# Patient Record
Sex: Female | Born: 1960
Health system: Southern US, Community
[De-identification: ages and names within clinical notes are randomized; demographics above are authoritative.]

## PROBLEM LIST (undated history)

## (undated) DIAGNOSIS — R51 Headache: Secondary | ICD-10-CM

## (undated) DIAGNOSIS — M1990 Unspecified osteoarthritis, unspecified site: Secondary | ICD-10-CM

## (undated) DIAGNOSIS — R002 Palpitations: Secondary | ICD-10-CM

## (undated) DIAGNOSIS — M179 Osteoarthritis of knee, unspecified: Secondary | ICD-10-CM

## (undated) DIAGNOSIS — E559 Vitamin D deficiency, unspecified: Secondary | ICD-10-CM

## (undated) DIAGNOSIS — K5900 Constipation, unspecified: Secondary | ICD-10-CM

## (undated) DIAGNOSIS — J309 Allergic rhinitis, unspecified: Secondary | ICD-10-CM

## (undated) DIAGNOSIS — M2550 Pain in unspecified joint: Secondary | ICD-10-CM

## (undated) DIAGNOSIS — M419 Scoliosis, unspecified: Secondary | ICD-10-CM

## (undated) DIAGNOSIS — R609 Edema, unspecified: Secondary | ICD-10-CM

## (undated) DIAGNOSIS — F419 Anxiety disorder, unspecified: Secondary | ICD-10-CM

## (undated) DIAGNOSIS — K219 Gastro-esophageal reflux disease without esophagitis: Secondary | ICD-10-CM

## (undated) DIAGNOSIS — M722 Plantar fascial fibromatosis: Secondary | ICD-10-CM

## (undated) DIAGNOSIS — M1710 Unilateral primary osteoarthritis, unspecified knee: Secondary | ICD-10-CM

## (undated) DIAGNOSIS — L700 Acne vulgaris: Secondary | ICD-10-CM

## (undated) DIAGNOSIS — R7303 Prediabetes: Secondary | ICD-10-CM

## (undated) DIAGNOSIS — I82409 Acute embolism and thrombosis of unspecified deep veins of unspecified lower extremity: Secondary | ICD-10-CM

## (undated) DIAGNOSIS — M719 Bursopathy, unspecified: Secondary | ICD-10-CM

## (undated) DIAGNOSIS — B351 Tinea unguium: Secondary | ICD-10-CM

## (undated) DIAGNOSIS — F32A Depression, unspecified: Secondary | ICD-10-CM

## (undated) HISTORY — DX: Edema, unspecified: R60.9

## (undated) HISTORY — DX: Unspecified osteoarthritis, unspecified site: M19.90

## (undated) HISTORY — DX: Vitamin D deficiency, unspecified: E55.9

## (undated) HISTORY — DX: Headache: R51

## (undated) HISTORY — DX: Acne vulgaris: L70.0

## (undated) HISTORY — DX: Pain in unspecified joint: M25.50

## (undated) HISTORY — DX: Acute embolism and thrombosis of unspecified deep veins of unspecified lower extremity: I82.409

## (undated) HISTORY — DX: Constipation, unspecified: K59.00

## (undated) HISTORY — PX: TONSILLECTOMY AND ADENOIDECTOMY: SHX28

## (undated) HISTORY — DX: Allergic rhinitis, unspecified: J30.9

## (undated) HISTORY — DX: Unilateral primary osteoarthritis, unspecified knee: M17.10

## (undated) HISTORY — PX: WISDOM TOOTH EXTRACTION: SHX21

## (undated) HISTORY — DX: Gastro-esophageal reflux disease without esophagitis: K21.9

## (undated) HISTORY — DX: Scoliosis, unspecified: M41.9

## (undated) HISTORY — DX: Prediabetes: R73.03

## (undated) HISTORY — DX: Anxiety disorder, unspecified: F41.9

## (undated) HISTORY — DX: Palpitations: R00.2

## (undated) HISTORY — DX: Plantar fascial fibromatosis: M72.2

## (undated) HISTORY — DX: Bursopathy, unspecified: M71.9

## (undated) HISTORY — DX: Tinea unguium: B35.1

## (undated) HISTORY — PX: ABLATION SAPHENOUS VEIN W/ RFA: SUR11

## (undated) HISTORY — DX: Depression, unspecified: F32.A

## (undated) HISTORY — DX: Osteoarthritis of knee, unspecified: M17.9

---

## 1994-11-29 HISTORY — PX: TUBAL LIGATION: SHX77

## 1999-06-26 ENCOUNTER — Other Ambulatory Visit: Admission: RE | Admit: 1999-06-26 | Discharge: 1999-06-26 | Payer: Self-pay | Admitting: Obstetrics and Gynecology

## 2000-09-20 ENCOUNTER — Other Ambulatory Visit: Admission: RE | Admit: 2000-09-20 | Discharge: 2000-09-20 | Payer: Self-pay | Admitting: Obstetrics and Gynecology

## 2001-02-10 ENCOUNTER — Encounter: Admission: RE | Admit: 2001-02-10 | Discharge: 2001-02-10 | Payer: Self-pay | Admitting: Obstetrics and Gynecology

## 2001-02-10 ENCOUNTER — Encounter: Payer: Self-pay | Admitting: Obstetrics and Gynecology

## 2002-02-13 ENCOUNTER — Other Ambulatory Visit: Admission: RE | Admit: 2002-02-13 | Discharge: 2002-02-13 | Payer: Self-pay | Admitting: Obstetrics and Gynecology

## 2002-02-27 ENCOUNTER — Encounter: Payer: Self-pay | Admitting: Obstetrics and Gynecology

## 2002-02-27 ENCOUNTER — Encounter: Admission: RE | Admit: 2002-02-27 | Discharge: 2002-02-27 | Payer: Self-pay | Admitting: Obstetrics and Gynecology

## 2002-06-04 ENCOUNTER — Encounter: Admission: RE | Admit: 2002-06-04 | Discharge: 2002-07-09 | Payer: Self-pay | Admitting: Orthopedic Surgery

## 2002-08-30 DIAGNOSIS — M719 Bursopathy, unspecified: Secondary | ICD-10-CM

## 2002-08-30 DIAGNOSIS — M722 Plantar fascial fibromatosis: Secondary | ICD-10-CM

## 2002-08-30 DIAGNOSIS — M419 Scoliosis, unspecified: Secondary | ICD-10-CM

## 2002-08-30 HISTORY — DX: Scoliosis, unspecified: M41.9

## 2002-08-30 HISTORY — DX: Plantar fascial fibromatosis: M72.2

## 2002-08-30 HISTORY — DX: Bursopathy, unspecified: M71.9

## 2003-02-19 ENCOUNTER — Other Ambulatory Visit: Admission: RE | Admit: 2003-02-19 | Discharge: 2003-02-19 | Payer: Self-pay | Admitting: Obstetrics and Gynecology

## 2003-03-12 ENCOUNTER — Encounter: Payer: Self-pay | Admitting: Obstetrics and Gynecology

## 2003-03-12 ENCOUNTER — Encounter: Admission: RE | Admit: 2003-03-12 | Discharge: 2003-03-12 | Payer: Self-pay | Admitting: Obstetrics and Gynecology

## 2004-03-17 ENCOUNTER — Other Ambulatory Visit: Admission: RE | Admit: 2004-03-17 | Discharge: 2004-03-17 | Payer: Self-pay | Admitting: Obstetrics and Gynecology

## 2004-04-13 ENCOUNTER — Encounter: Admission: RE | Admit: 2004-04-13 | Discharge: 2004-04-13 | Payer: Self-pay | Admitting: Obstetrics and Gynecology

## 2005-05-19 ENCOUNTER — Encounter: Admission: RE | Admit: 2005-05-19 | Discharge: 2005-05-19 | Payer: Self-pay | Admitting: Obstetrics and Gynecology

## 2005-06-28 ENCOUNTER — Other Ambulatory Visit: Admission: RE | Admit: 2005-06-28 | Discharge: 2005-06-28 | Payer: Self-pay | Admitting: Obstetrics and Gynecology

## 2006-05-24 ENCOUNTER — Encounter: Admission: RE | Admit: 2006-05-24 | Discharge: 2006-05-24 | Payer: Self-pay | Admitting: Obstetrics and Gynecology

## 2006-06-30 ENCOUNTER — Other Ambulatory Visit: Admission: RE | Admit: 2006-06-30 | Discharge: 2006-06-30 | Payer: Self-pay | Admitting: Obstetrics & Gynecology

## 2007-06-13 ENCOUNTER — Encounter: Admission: RE | Admit: 2007-06-13 | Discharge: 2007-06-13 | Payer: Self-pay | Admitting: Obstetrics and Gynecology

## 2007-07-20 ENCOUNTER — Other Ambulatory Visit: Admission: RE | Admit: 2007-07-20 | Discharge: 2007-07-20 | Payer: Self-pay | Admitting: Obstetrics & Gynecology

## 2008-06-13 ENCOUNTER — Encounter: Admission: RE | Admit: 2008-06-13 | Discharge: 2008-06-13 | Payer: Self-pay | Admitting: Obstetrics and Gynecology

## 2008-06-21 ENCOUNTER — Encounter: Admission: RE | Admit: 2008-06-21 | Discharge: 2008-06-21 | Payer: Self-pay

## 2008-08-15 ENCOUNTER — Other Ambulatory Visit: Admission: RE | Admit: 2008-08-15 | Discharge: 2008-08-15 | Payer: Self-pay | Admitting: Obstetrics and Gynecology

## 2009-06-30 ENCOUNTER — Encounter: Admission: RE | Admit: 2009-06-30 | Discharge: 2009-06-30 | Payer: Self-pay | Admitting: Obstetrics and Gynecology

## 2010-02-25 DIAGNOSIS — I82409 Acute embolism and thrombosis of unspecified deep veins of unspecified lower extremity: Secondary | ICD-10-CM

## 2010-02-25 HISTORY — DX: Acute embolism and thrombosis of unspecified deep veins of unspecified lower extremity: I82.409

## 2010-07-10 ENCOUNTER — Encounter: Admission: RE | Admit: 2010-07-10 | Discharge: 2010-07-10 | Payer: Self-pay | Admitting: Obstetrics and Gynecology

## 2010-08-30 DIAGNOSIS — B351 Tinea unguium: Secondary | ICD-10-CM

## 2010-08-30 HISTORY — DX: Tinea unguium: B35.1

## 2010-10-14 ENCOUNTER — Other Ambulatory Visit: Payer: Self-pay | Admitting: Obstetrics and Gynecology

## 2010-10-14 DIAGNOSIS — M84375A Stress fracture, left foot, initial encounter for fracture: Secondary | ICD-10-CM

## 2010-10-21 ENCOUNTER — Ambulatory Visit
Admission: RE | Admit: 2010-10-21 | Discharge: 2010-10-21 | Disposition: A | Discharge status: Home / Self Care | Payer: BC Managed Care – PPO | Source: Ambulatory Visit | Attending: Obstetrics and Gynecology | Admitting: Obstetrics and Gynecology

## 2010-10-21 DIAGNOSIS — M84375A Stress fracture, left foot, initial encounter for fracture: Secondary | ICD-10-CM

## 2010-12-15 ENCOUNTER — Ambulatory Visit: Payer: BC Managed Care – PPO | Admitting: Physical Therapy

## 2011-07-26 ENCOUNTER — Other Ambulatory Visit: Payer: Self-pay | Admitting: Obstetrics and Gynecology

## 2011-07-26 DIAGNOSIS — Z1231 Encounter for screening mammogram for malignant neoplasm of breast: Secondary | ICD-10-CM

## 2011-08-18 ENCOUNTER — Ambulatory Visit: Payer: BC Managed Care – PPO

## 2011-08-27 ENCOUNTER — Ambulatory Visit (INDEPENDENT_AMBULATORY_CARE_PROVIDER_SITE_OTHER): Payer: BC Managed Care – PPO

## 2011-08-27 DIAGNOSIS — M545 Low back pain, unspecified: Secondary | ICD-10-CM

## 2011-08-27 DIAGNOSIS — M25519 Pain in unspecified shoulder: Secondary | ICD-10-CM

## 2011-08-27 DIAGNOSIS — Z79899 Other long term (current) drug therapy: Secondary | ICD-10-CM

## 2011-09-01 ENCOUNTER — Ambulatory Visit
Admission: RE | Admit: 2011-09-01 | Discharge: 2011-09-01 | Disposition: A | Discharge status: Home / Self Care | Payer: BC Managed Care – PPO | Source: Ambulatory Visit | Attending: Obstetrics and Gynecology | Admitting: Obstetrics and Gynecology

## 2011-09-01 ENCOUNTER — Encounter: Payer: Self-pay | Admitting: Physician Assistant

## 2011-09-01 DIAGNOSIS — J302 Other seasonal allergic rhinitis: Secondary | ICD-10-CM | POA: Insufficient documentation

## 2011-09-01 DIAGNOSIS — M1710 Unilateral primary osteoarthritis, unspecified knee: Secondary | ICD-10-CM | POA: Insufficient documentation

## 2011-09-01 DIAGNOSIS — Z1231 Encounter for screening mammogram for malignant neoplasm of breast: Secondary | ICD-10-CM

## 2011-09-01 DIAGNOSIS — M179 Osteoarthritis of knee, unspecified: Secondary | ICD-10-CM | POA: Insufficient documentation

## 2011-09-01 DIAGNOSIS — Z86718 Personal history of other venous thrombosis and embolism: Secondary | ICD-10-CM | POA: Insufficient documentation

## 2011-10-12 LAB — HM PAP SMEAR: HM Pap smear: NEGATIVE

## 2011-11-16 ENCOUNTER — Encounter: Payer: Self-pay | Admitting: Physician Assistant

## 2012-02-08 LAB — POCT INR: INR: 2 — AB (ref <=1.1)

## 2012-03-21 LAB — POCT INR: INR: 1.8 — AB (ref <=1.1)

## 2012-03-27 ENCOUNTER — Encounter: Payer: Self-pay | Admitting: Physician Assistant

## 2012-05-16 ENCOUNTER — Ambulatory Visit (INDEPENDENT_AMBULATORY_CARE_PROVIDER_SITE_OTHER): Payer: BC Managed Care – PPO | Admitting: *Deleted

## 2012-05-16 DIAGNOSIS — Z23 Encounter for immunization: Secondary | ICD-10-CM

## 2012-06-13 ENCOUNTER — Encounter: Payer: Self-pay | Admitting: *Deleted

## 2012-06-13 DIAGNOSIS — I82409 Acute embolism and thrombosis of unspecified deep veins of unspecified lower extremity: Secondary | ICD-10-CM

## 2012-06-16 ENCOUNTER — Encounter: Payer: Self-pay | Admitting: *Deleted

## 2012-06-16 DIAGNOSIS — I82409 Acute embolism and thrombosis of unspecified deep veins of unspecified lower extremity: Secondary | ICD-10-CM

## 2012-07-07 ENCOUNTER — Encounter: Payer: Self-pay | Admitting: Physician Assistant

## 2012-07-07 ENCOUNTER — Ambulatory Visit (INDEPENDENT_AMBULATORY_CARE_PROVIDER_SITE_OTHER): Payer: BC Managed Care – PPO | Admitting: Physician Assistant

## 2012-07-07 VITALS — BP 120/68 | HR 64 | Temp 98.0°F | Resp 16 | Ht 68.0 in | Wt 210.2 lb

## 2012-07-07 DIAGNOSIS — R05 Cough: Secondary | ICD-10-CM

## 2012-07-07 DIAGNOSIS — J069 Acute upper respiratory infection, unspecified: Secondary | ICD-10-CM

## 2012-07-07 DIAGNOSIS — R059 Cough, unspecified: Secondary | ICD-10-CM

## 2012-07-07 MED ORDER — HYDROCOD POLST-CHLORPHEN POLST 10-8 MG/5ML PO LQCR: 5.0000 mL | Freq: Two times a day (BID) | ORAL | Status: DC

## 2012-07-07 MED ORDER — BENZONATATE 100 MG PO CAPS: ORAL_CAPSULE | ORAL | Status: DC

## 2012-07-07 MED ORDER — GUAIFENESIN ER 1200 MG PO TB12: 1.0000 | ORAL_TABLET | Freq: Two times a day (BID) | ORAL | Status: DC

## 2012-07-07 NOTE — Patient Instructions (Signed)
Push fluids.  Tylenol prn .  No motrin because she is on coumadin.

## 2012-07-07 NOTE — Progress Notes (Signed)
   [address] [place] [address]   Phone [phone]  Subjective:    Patient ID: Katherine Adams, female    DOB: May 28, 1961, 51 y.o.   MRN: [id number]  HPI  Pt presents to clinic with 5 d h/o cold symptoms. Started abruptly with sore throat and HA and then congestion started with some PND and dry cough that is keeping her up at night. She is tired because of lack of sleep.  The cough is like a tickle in the throat.  She has tried mucinex D but it keeps her up at night but when she stopped it she got increased congestion.  She has had no sick contacts.  She has been told by vein specialist that she can stop her coumadin from her DVT 2 years ago.  She has cleared it with Wynne Dust who doses her coumadin.  She wants to see if we agree that she can stop it.  Review of Systems  Constitutional: Negative for fever and chills.  HENT: Positive for congestion, sore throat (improved slightly), rhinorrhea, postnasal drip and sinus pressure.   Respiratory: Positive for cough (dry).   Neurological: Positive for headaches.  Psychiatric/Behavioral: Positive for sleep disturbance (2nd to cough).       Objective:   Physical Exam  Vitals reviewed. Constitutional: She is oriented to person, place, and time. She appears well-developed and well-nourished.  HENT:  Head: Normocephalic and atraumatic.  Right Ear: Hearing, external ear and ear canal normal. A middle ear effusion is present.  Left Ear: Hearing, external ear and ear canal normal. A middle ear effusion is present.  Nose: Nose normal. No mucosal edema.  Mouth/Throat: Uvula is midline and oropharynx is clear and moist. No oropharyngeal exudate.  Eyes: Conjunctivae normal are normal.  Neck: Normal range of motion. Neck supple.  Cardiovascular: Normal rate, regular rhythm and normal heart sounds.   Pulmonary/Chest: Effort normal and breath sounds normal.  Lymphadenopathy:    She has cervical adenopathy (mild TTP).  Neurological: She is  alert and oriented to person, place, and time.  Skin: Skin is warm and dry.  Psychiatric: She has a normal mood and affect. Her behavior is normal. Judgment and thought content normal.       Assessment & Plan:   1. URI (upper respiratory infection)  Guaifenesin (MUCINEX MAXIMUM STRENGTH) 1200 MG TB12, benzonatate (TESSALON) 100 MG capsule  2. Cough  chlorpheniramine-HYDROcodone (TUSSIONEX PENNKINETIC ER) 10-8 MG/5ML LQCR   Will d/w Porfirio Oar about stopping her coumadin because vein specialist have given her the clear to stop coumadin.  Call in 4-5 d if not improved with respect to her cold symptoms because at that time it may have changed to bacterial.  Pt understands and agrees with the above.

## 2012-07-14 ENCOUNTER — Telehealth: Payer: Self-pay | Admitting: Physician Assistant

## 2012-07-14 NOTE — Telephone Encounter (Signed)
I discussed patient with Chelle and we both agree that it is ok that she stop her Coumadin.  She can not take her dose tonight and she should not have to get her PT/INR checked again.

## 2012-07-25 ENCOUNTER — Encounter: Payer: Self-pay | Admitting: Physician Assistant

## 2012-07-25 DIAGNOSIS — I82409 Acute embolism and thrombosis of unspecified deep veins of unspecified lower extremity: Secondary | ICD-10-CM

## 2012-09-27 ENCOUNTER — Other Ambulatory Visit: Payer: Self-pay | Admitting: Obstetrics and Gynecology

## 2012-09-27 DIAGNOSIS — Z1231 Encounter for screening mammogram for malignant neoplasm of breast: Secondary | ICD-10-CM

## 2012-10-19 ENCOUNTER — Encounter: Payer: Self-pay | Admitting: Physician Assistant

## 2012-10-19 ENCOUNTER — Ambulatory Visit (INDEPENDENT_AMBULATORY_CARE_PROVIDER_SITE_OTHER): Payer: BC Managed Care – PPO | Admitting: Physician Assistant

## 2012-10-19 VITALS — BP 122/74 | HR 72 | Temp 97.7°F | Resp 16 | Ht 67.5 in

## 2012-10-19 DIAGNOSIS — G43909 Migraine, unspecified, not intractable, without status migrainosus: Secondary | ICD-10-CM

## 2012-10-19 DIAGNOSIS — J309 Allergic rhinitis, unspecified: Secondary | ICD-10-CM

## 2012-10-19 DIAGNOSIS — J302 Other seasonal allergic rhinitis: Secondary | ICD-10-CM

## 2012-10-19 MED ORDER — BACLOFEN 10 MG PO TABS: 10.0000 mg | ORAL_TABLET | Freq: Three times a day (TID) | ORAL | Status: DC

## 2012-10-19 MED ORDER — FLUTICASONE PROPIONATE 50 MCG/ACT NA SUSP: 2.0000 | Freq: Every day | NASAL | Status: DC | PRN

## 2012-10-20 NOTE — Progress Notes (Signed)
Subjective:    Patient ID: Katherine Adams, female    DOB: [birth year], 52 y.o.   MRN: [id number]  HPI A 52 year old female presents today for RFs of fluticasone and baclofen.  Pt complains of seasonal allergies.  She normally takes mucinex and zyrtec, but when her congestion persists she gets fluticasone filled.  Pt has hx of migraine HA and bilateral lower extremity blood clots.  The HA center took her off of her normal migraine medications 1 year ago, due to the DVTs and treatment.since she has been taking tylenol and baclofen to control her migraines.  Pt admits to nasal congestion, itchy ears, and post-nasal drip.  Pt denies HA, CP, SOB, calf pain.       Past Medical History  Diagnosis Date  . Allergic rhinitis, cause unspecified   . Onychomycosis   . Headache   . DVT (deep venous thrombosis) 02/25/2010    Bilateral LE  . Acne vulgaris   . 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 Tubal ligation    . Tonsillectomy      Prior to Admission medications   Medication Sig Start Date End Date Taking? Authorizing Provider  calcium carbonate (OS-CAL) 600 MG TABS Take 600 mg by mouth 2 (two) times daily with a meal.     Yes Historical Provider, MD  cetirizine (ZYRTEC) 10 MG tablet Take 10 mg by mouth daily.     Yes Historical Provider, MD  FLUoxetine (PROZAC) 10 MG tablet Take 10 mg by mouth daily.     Yes Historical Provider, MD  magnesium gluconate (MAGONATE) 500 MG tablet Take 500 mg by mouth daily.     Yes Historical Provider, MD  Multiple Vitamins-Minerals (MULTIVITAMIN PO) Take by mouth daily.     Yes Historical Provider, MD  spironolactone (ALDACTONE) 50 MG tablet Take 50 mg by mouth daily.     Yes Historical Provider, MD  baclofen (LIORESAL) 10 MG tablet Take 1 tablet (10 mg total) by mouth 3 (three) times daily. 10/19/12   Jessica Checketts S Nyiesha Beever, PA-C  fluticasone (FLONASE) 50 MCG/ACT nasal spray Place 2 sprays into  the nose daily as needed for allergies. 10/19/12   Meklit Cotta Tessa Lerner, PA-C    Allergies  Allergen Reactions  . Levofloxacin Other (See Comments)    Joint pain, fluid collection  . Tape Itching and Other (See Comments)    redness  . Tramadol Nausea And Vomiting  . Triptans     hypercoagulability  . Naproxen Sodium Rash    History   Social History  . Marital Status: Married    Spouse Name: N/A    Number of Children: N/A  . Years of Education: N/A   Occupational History  . Not on file.   Social History Main Topics  . Smoking status: Never Smoker   . Smokeless tobacco: Not on file  . Alcohol Use:   . Drug Use:   . Sexually Active:     No family history on file.   Review of Systems   As above. Objective:   Physical Exam  BP 122/74  Pulse 72  Temp(Src) 97.7 F (36.5 C)  Resp 16  Ht 5' 7.5" (1.715 m)  General:  Pleasant, obese female.  NAD. HEENT:  NCAT.  PERRLA.  EOMs.  No erythema or buldging of TMs, all landmarks visible.  Mild edema and erythema of turbinates.  Pharynx pink  and moist, no tonsillar exudate.  No nystagmus. Heart:  RRR.  Normal S1, S2.  No m/g/r. Lungs:  CTAB. MSK: No palpable cords on posterior calves.  Full ROM of knees and ankles.   Peripheral vascular:  Bilateral 2+ pulses dorsalis pedis and posterior tibialis. Skin:  Warm and moist.  Discolored veins on bilateral lower legs from where she had procedure done for blood clots.      Assessment & Plan:  Migraine headache - Plan: baclofen (LIORESAL) 10 MG tablet  Seasonal allergies - Plan: fluticasone (FLONASE) 50 MCG/ACT nasal spray  Continue follow-up with [facility] per their recommendations.    RTC 6-12 months.

## 2012-10-25 ENCOUNTER — Ambulatory Visit
Admission: RE | Admit: 2012-10-25 | Discharge: 2012-10-25 | Disposition: A | Discharge status: Home / Self Care | Payer: BC Managed Care – PPO | Source: Ambulatory Visit | Attending: Obstetrics and Gynecology | Admitting: Obstetrics and Gynecology

## 2012-10-25 DIAGNOSIS — Z1231 Encounter for screening mammogram for malignant neoplasm of breast: Secondary | ICD-10-CM

## 2013-01-01 ENCOUNTER — Encounter: Payer: Self-pay | Admitting: Nurse Practitioner

## 2013-01-02 ENCOUNTER — Encounter: Payer: Self-pay | Admitting: Nurse Practitioner

## 2013-01-02 ENCOUNTER — Ambulatory Visit (INDEPENDENT_AMBULATORY_CARE_PROVIDER_SITE_OTHER): Payer: BC Managed Care – PPO | Admitting: Nurse Practitioner

## 2013-01-02 VITALS — BP 126/60 | HR 68 | Resp 14 | Ht 67.5 in | Wt 211.0 lb

## 2013-01-02 DIAGNOSIS — Z01419 Encounter for gynecological examination (general) (routine) without abnormal findings: Secondary | ICD-10-CM

## 2013-01-02 DIAGNOSIS — Z0000 Encounter for general adult medical examination without abnormal findings: Secondary | ICD-10-CM

## 2013-01-02 DIAGNOSIS — N912 Amenorrhea, unspecified: Secondary | ICD-10-CM

## 2013-01-02 LAB — POCT URINALYSIS DIPSTICK
Leukocytes, UA: NEGATIVE
Spec Grav, UA: 1.015
Urobilinogen, UA: NEGATIVE

## 2013-01-02 MED ORDER — MEDROXYPROGESTERONE ACETATE 10 MG PO TABS: 10.0000 mg | ORAL_TABLET | Freq: Every day | ORAL | Status: DC

## 2013-01-02 NOTE — Progress Notes (Signed)
52 y.o. G46P2000 Married Caucasian Fe here for annual exam.  LMP 04/04/2012 with normal flow.  Lat year Menses in March 2013 then no cycle until July 2013 that was normal.  2 wk's later another cycle that was for 3 days and light.  Then 2 weeks later another cycle that was normal.  About every 3rd month since then has had symptoms of PMS and lower abdominal cramps like menses was going to start.  Some vaso symptoms that are tolerable. Sleep is disrupted by night sweats.  Daughter will graduate college this weekend and is moving back how for a short time until established with new job.   Patient's last menstrual period was 04/04/2012.          Sexually active: yes  The current method of family planning is tubal ligation.    Exercising: no  The patient does not participate in regular exercise at present. Smoker:  no  Health Maintenance: Pap:  10/12/2011  Normal with negative HR HPV MMG:  10/25/2012  Normal  Colonoscopy: Was on anticoagulant and now off since 06/2012, will schedule repeat consult. BMD:   09/2010 normal TDaP:  Unsure. Up to date, per pt.  Labs: done here in office on 10/05/2012 and was normal.    reports that she has never smoked. She has never used smokeless tobacco. She reports that she does not use illicit drugs.  Past Medical History  Diagnosis Date  . Allergic rhinitis, cause unspecified   . Onychomycosis   . Headache     migraines  . DVT (deep venous thrombosis) 02/25/2010    Bilateral LE  . Acne vulgaris   . 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 Bursitis 2004    left hip  . Scoliosis 2004    mild  . Plantar fasciitis 2004  . H/O blood clots 2011    Past Surgical History  Procedure Laterality Date  . Tubal ligation Bilateral 11/1994  . Tonsillectomy and adenoidectomy      Current Outpatient Prescriptions  Medication Sig Dispense Refill  . baclofen (LIORESAL) 10 MG tablet Take 1 tablet (10 mg total) by mouth 3 (three) times daily.  30 each   3  . calcium carbonate (OS-CAL) 600 MG TABS Take 600 mg by mouth 2 (two) times daily with a meal.        . cetirizine (ZYRTEC) 10 MG tablet Take 10 mg by mouth daily.        . Cholecalciferol (VITAMIN D) 2000 UNITS tablet Take 2,000 Units by mouth 2 (two) times daily.      Marland Kitchen FLUoxetine (PROZAC) 10 MG tablet Take 10 mg by mouth daily.        . fluticasone (FLONASE) 50 MCG/ACT nasal spray Place 2 sprays into the nose daily as needed for allergies.  16 g  12  . glucosamine-chondroitin 500-400 MG tablet Take 1 tablet by mouth 2 (two) times daily.      Marland Kitchen KRILL OIL 1000 MG CAPS Take by mouth daily.      . magnesium gluconate (MAGONATE) 500 MG tablet Take 500 mg by mouth daily.        . Multiple Vitamins-Minerals (MULTIVITAMIN PO) Take by mouth daily.        Marland Kitchen spironolactone (ALDACTONE) 50 MG tablet Take 50 mg by mouth daily.         No current facility-administered medications for this visit.    Family History  Problem Relation Age of  Onset  . Diabetes    . Stroke    . Heart disease      ROS:  Pertinent items are noted in HPI.  Otherwise, a comprehensive ROS was negative.  Exam:   BP 126/60  Pulse 68  Resp 14  Ht 5' 7.5" (1.715 m)  Wt 211 lb (95.709 kg)  BMI 32.54 kg/m2  LMP 04/04/2012 Height: 5' 7.5" (171.5 cm)  Ht Readings from Last 3 Encounters:  01/02/13 5' 7.5" (1.715 m)  10/19/12 5' 7.5" (1.715 m)  07/07/12 5\' 8"  (1.727 m)    General appearance: alert, cooperative and appears stated age Head: Normocephalic, without obvious abnormality, atraumatic Neck: no adenopathy, supple, symmetrical, trachea midline and thyroid normal to inspection and palpation Lungs: clear to auscultation bilaterally Breasts: normal appearance, no masses or tenderness Heart: regular rate and rhythm Abdomen: soft, non-tender; no masses,  no organomegaly Extremities: extremities normal, atraumatic, no cyanosis or edema Skin: Skin color, texture, turgor normal. No rashes or lesions Lymph nodes:  Cervical, supraclavicular, and axillary nodes normal. No abnormal inguinal nodes palpated Neurologic: Grossly normal   Pelvic: External genitalia:  no lesions              Urethra:  normal appearing urethra with no masses, tenderness or lesions              Bartholin's and Skene's: normal                 Vagina: normal appearing vagina with normal color and discharge, no lesions              Cervix: anteverted              Pap taken: no Bimanual Exam:  Uterus:  normal size, contour, position, consistency, mobility, non-tender              Adnexa: no mass, fullness, tenderness               Rectovaginal: Confirms               Anus:  normal sphincter tone, no lesions  A:  Well Woman with normal exam  Perimenopausal consistent with irregular menses  Amenorrhea since 03/2012  P:   Pap smear as per guidelines   Mammogram due again 09/2013  Provera challenge and will call back with +/- results  Discussed at length about menopausal changes  Consult to Dr. Loreta Ave for screening colonoscopy  return annually or prn  Additional discussion time at 15 minutes. An After Visit Summary was printed and given to the patient.

## 2013-01-02 NOTE — Patient Instructions (Addendum)
EXERCISE AND DIET:  We recommended that you start or continue a regular exercise program for good health. Regular exercise means any activity that makes your heart beat faster and makes you sweat.  We recommend exercising at least 30 minutes per day at least 3 days a week, preferably 4 or 5.  We also recommend a diet low in fat and sugar.  Inactivity, poor dietary choices and obesity can cause diabetes, heart attack, stroke, and kidney damage, among others.    ALCOHOL AND SMOKING:  Women should limit their alcohol intake to no more than 7 drinks/beers/glasses of wine (combined, not each!) per week. Moderation of alcohol intake to this level decreases your risk of breast cancer and liver damage. And of course, no recreational drugs are part of a healthy lifestyle.  And absolutely no smoking or even second hand smoke. Most people know smoking can cause heart and lung diseases, but did you know it also contributes to weakening of your bones? Aging of your skin?  Yellowing of your teeth and nails?  CALCIUM AND VITAMIN D:  Adequate intake of calcium and Vitamin D are recommended.  The recommendations for exact amounts of these supplements seem to change often, but generally speaking 600 mg of calcium (either carbonate or citrate) and 800 units of Vitamin D per day seems prudent. Certain women may benefit from higher intake of Vitamin D.  If you are among these women, your doctor will have told you during your visit.    PAP SMEARS:  Pap smears, to check for cervical cancer or precancers,  have traditionally been done yearly, although recent scientific advances have shown that most women can have pap smears less often.  However, every woman still should have a physical exam from her gynecologist every year. It will include a breast check, inspection of the vulva and vagina to check for abnormal growths or skin changes, a visual exam of the cervix, and then an exam to evaluate the size and shape of the uterus and  ovaries.  And after 52 years of age, a rectal exam is indicated to check for rectal cancers. We will also provide age appropriate advice regarding health maintenance, like when you should have certain vaccines, screening for sexually transmitted diseases, bone density testing, colonoscopy, mammograms, etc.   MAMMOGRAMS:  All women over 40 years old should have a yearly mammogram. Many facilities now offer a "3D" mammogram, which may cost around $50 extra out of pocket. If possible,  we recommend you accept the option to have the 3D mammogram performed.  It both reduces the number of women who will be called back for extra views which then turn out to be normal, and it is better than the routine mammogram at detecting truly abnormal areas.    COLONOSCOPY:  Colonoscopy to screen for colon cancer is recommended for all women at age 50.  We know, you hate the idea of the prep.  We agree, BUT, having colon cancer and not knowing it is worse!!  Colon cancer so often starts as a polyp that can be seen and removed at colonscopy, which can quite literally save your life!  And if your first colonoscopy is normal and you have no family history of colon cancer, most women don't have to have it again for 10 years.  Once every ten years, you can do something that may end up saving your life, right?  We will be happy to help you get it scheduled when you are ready.    Be sure to check your insurance coverage so you understand how much it will cost.  It may be covered as a preventative service at no cost, but you should check your particular policy.    You will take Provera for 10 days - then expect a withdrawal bleed within two weeks after the last pill.  Record any bleeding and call back with +/ - response.  If you do bleed if may trigger you to have another cycle on your own.   Just record this and again in 3 months if no bleeding to call back. If bleeding is prolonged to call back.  If no bleeding may need hormone test -FSH  to see where you are in menopause.

## 2013-01-04 NOTE — Progress Notes (Signed)
Encounter reviewed by Dr. Taim Wurm Silva.  

## 2013-01-08 ENCOUNTER — Telehealth: Payer: Self-pay | Admitting: *Deleted

## 2013-01-08 ENCOUNTER — Telehealth: Payer: Self-pay | Admitting: Nurse Practitioner

## 2013-01-08 NOTE — Telephone Encounter (Signed)
Calling to speak with Katherine Adams in regards to medication and making an appointment

## 2013-01-08 NOTE — Telephone Encounter (Signed)
PATIENT NOTIFIED OF APPT. WITH DR. Arty Baumgartner, MAY 20TH @ 9:30am. Fannie Knee

## 2013-01-08 NOTE — Telephone Encounter (Signed)
LEFT MESSAGE OF NEED TO CALL OFFICE FOR DAYE AND TIME OF COLONOSCOPY, MAY 20TH @ 9:30am , DR. Shela Commons. [facility] OFFICE, [facility].

## 2013-01-12 ENCOUNTER — Telehealth: Payer: Self-pay | Admitting: Nurse Practitioner

## 2013-01-12 NOTE — Telephone Encounter (Signed)
Pt would like to speak with nurse regarding a medication Patty put her on to start her period.

## 2013-01-12 NOTE — Telephone Encounter (Signed)
Patient has not statred her provera challenge yet due to an unexpected funeral and di not want to be "emotional" She is ready to start now and was reviewing package insert and is concerned about the warnings for DVT risk and she reports she has a history of DVT and wants to be sure Patty wants her to take this medicine.  Expalined that usually the risk for DVT is associated with the estrogen medications but will confirm this with Patty and let her know. Patient also requests revill on her Prozac 10 mg to [facility].  She forgot to ask when here for AEX.

## 2013-01-15 MED ORDER — FLUOXETINE HCL 10 MG PO TABS: 10.0000 mg | ORAL_TABLET | Freq: Every day | ORAL | Status: DC

## 2013-01-15 NOTE — Telephone Encounter (Signed)
Patient calling to check on the status of her last call question.

## 2013-01-15 NOTE — Telephone Encounter (Signed)
Sorry about her loss. Yes she needs to take this - Estrogen is the risk factor for DVT.  And she is very active anyway.   She then will CB with any bleeding + /- OK to refill Prozac - I also saw that I did not refill but thought maybe her PCP was doing this  - my error in not calling her back to confirm.

## 2013-01-15 NOTE — Telephone Encounter (Signed)
Patient notified of instructions as patty recommends and will send RX for Prozac to [facility].  Patient instructed to call us with response to Provera.

## 2013-01-26 ENCOUNTER — Telehealth: Payer: Self-pay | Admitting: Nurse Practitioner

## 2013-01-26 NOTE — Telephone Encounter (Signed)
Patient was calling to let Shirlyn Goltz or either her nurse know how the progesterone has been working for her. Patient did not go into further details with me.

## 2013-01-26 NOTE — Telephone Encounter (Signed)
Patient called to let know of day 3 of last progesterone tablet taken. No change . Some ache in lower abdomen. No bleeding noted. Patient will call back in 7 days to give report. sue

## 2013-01-26 NOTE — Telephone Encounter (Signed)
Left message on CB# to return our call regarding her medication.

## 2013-04-20 ENCOUNTER — Telehealth: Payer: Self-pay | Admitting: Nurse Practitioner

## 2013-04-20 NOTE — Telephone Encounter (Signed)
LMTRC on CB# VM concerning hormone medication.

## 2013-04-20 NOTE — Telephone Encounter (Signed)
Patient needs Katherine Adams's nurse to call her back concerning her Hormones Challenge.

## 2013-04-25 NOTE — Telephone Encounter (Signed)
Spoke with pt who took provera in late [facility] June. Pt started bleeding 01-27-13 and bled through 01-31-13, flow was moderate, then heavy, back to moderate, and then light the last day. Pt has not taken provera since and has had no more bleeding since June. Pt is now experiencing swollen sore breasts, but no other symptoms. Does she need another provera challenge or any other advice?

## 2013-04-26 ENCOUNTER — Other Ambulatory Visit: Payer: Self-pay | Admitting: Orthopedic Surgery

## 2013-04-26 DIAGNOSIS — N912 Amenorrhea, unspecified: Secondary | ICD-10-CM

## 2013-04-26 MED ORDER — MEDROXYPROGESTERONE ACETATE 10 MG PO TABS: 10.0000 mg | ORAL_TABLET | Freq: Every day | ORAL | Status: DC

## 2013-04-26 NOTE — Telephone Encounter (Signed)
LM on pt's VM re: PG's advice with repeating Provera challenge and calling us with results.

## 2013-04-26 NOTE — Telephone Encounter (Signed)
Yes redo Provera challenge of Provera 10 mg for 10 days and still call us with response to challenge.

## 2013-05-09 ENCOUNTER — Telehealth: Payer: Self-pay | Admitting: Nurse Practitioner

## 2013-05-09 NOTE — Telephone Encounter (Signed)
Patient needs to discuss her hormone challenge.

## 2013-05-10 NOTE — Telephone Encounter (Signed)
Spoke with pt who was about to start another provera challenge when she started bleeding on her own this past weekend. The last time pt had bleeding was in June with Provera. Pt also complained of right lower quadrant pain like her ovary was "twisted." Pt has never had pain like this. It lasted 5 days, easing off yesterday. Sched OV with PG 05-14-13 at 10:15 per pt request.

## 2013-05-14 ENCOUNTER — Ambulatory Visit (INDEPENDENT_AMBULATORY_CARE_PROVIDER_SITE_OTHER): Payer: BC Managed Care – PPO | Admitting: Nurse Practitioner

## 2013-05-14 ENCOUNTER — Encounter: Payer: Self-pay | Admitting: Nurse Practitioner

## 2013-05-14 VITALS — BP 122/78 | HR 64 | Temp 99.0°F | Resp 16 | Ht 67.5 in | Wt 218.0 lb

## 2013-05-14 DIAGNOSIS — R1031 Right lower quadrant pain: Secondary | ICD-10-CM

## 2013-05-14 LAB — POCT URINALYSIS DIPSTICK
Bilirubin, UA: NEGATIVE
Blood, UA: NEGATIVE
Glucose, UA: NEGATIVE
Urobilinogen, UA: NEGATIVE

## 2013-05-14 NOTE — Progress Notes (Signed)
Subjective:     Patient ID: Katherine Adams, female   DOB: [birth year], 52 y.o.   MRN: [id number]  HPI  52 yo WM Fe presents with RLQ pain associated with vaginal bleeding on 9/6.  Menses lasted for 4 days moderate flow and light spotting for 2 days that was brown to yellow. Now  pain was better until yesterday with twinges again RLQ.  Treated with Ibuprofen and heat pack.  She felt associated fatigue, breast tenderness for 3 weeks. Denies   vaginal itching or burning.  Previous menses was 01/25/13 very light spotting then 5/31 in the afternoon bleeding heavier through 6/4.  Before the cycle on May 29 she had no bleeding with a Provera challenge in early May.   Previous Provera challenge with 4 day cycle last year.  She also had been having a cycle about every 3 months. She understands that this is normal in perimenopause but the pain was different and more intense on 9/6 and is concerned about ovarian disease/ cancer.   Review of Systems  Constitutional: Positive for fatigue. Negative for fever and chills.  HENT: Negative.   Respiratory: Negative.   Cardiovascular: Negative.   Gastrointestinal: Positive for abdominal pain and abdominal distention. Negative for nausea, vomiting, diarrhea, constipation and rectal pain.  Endocrine: Negative.   Genitourinary: Positive for vaginal bleeding, vaginal discharge, menstrual problem and pelvic pain. Negative for dysuria, frequency, hematuria, flank pain, decreased urine volume, difficulty urinating and vaginal pain.  Musculoskeletal: Negative.   Skin: Negative.   Neurological: Negative.   Psychiatric/Behavioral: Negative.        Objective:   Physical Exam  Constitutional: She appears well-developed and well-nourished.  Cardiovascular: Normal rate.   Pulmonary/Chest: Effort normal.  Abdominal: Soft. She exhibits no distension and no mass. There is tenderness. There is no rebound and no guarding.  RLQ some twinges of pain to deep palpation.   Genitourinary:  Normal vaginal discharge, no cervicitis, no mass.  Unable to reproduce the same pain on bimanual. Rectal exam is negative  Musculoskeletal: Normal range of motion.  Neurological: She is alert.  Psychiatric: She has a normal mood and affect. Her behavior is normal. Judgment and thought content normal.       Assessment:     RLQ pain, possible Ovarian cyst Perimenopausal consistent with abnormal menses     Plan:     Will get PUS and follow - I feel that patient needs peace of mind that no cancer is present.

## 2013-05-14 NOTE — Patient Instructions (Signed)
We will call you with pelvic ultrasound info

## 2013-05-16 ENCOUNTER — Telehealth: Payer: Self-pay | Admitting: Nurse Practitioner

## 2013-05-16 ENCOUNTER — Other Ambulatory Visit: Payer: Self-pay | Admitting: Obstetrics & Gynecology

## 2013-05-16 DIAGNOSIS — N926 Irregular menstruation, unspecified: Secondary | ICD-10-CM

## 2013-05-16 NOTE — Telephone Encounter (Signed)
LMTCB to schedule PUS, advised PR is $25 copay.

## 2013-05-17 ENCOUNTER — Encounter: Payer: Self-pay | Admitting: Obstetrics & Gynecology

## 2013-05-17 ENCOUNTER — Ambulatory Visit (INDEPENDENT_AMBULATORY_CARE_PROVIDER_SITE_OTHER): Payer: BC Managed Care – PPO

## 2013-05-17 ENCOUNTER — Ambulatory Visit (INDEPENDENT_AMBULATORY_CARE_PROVIDER_SITE_OTHER): Payer: BC Managed Care – PPO | Admitting: Obstetrics & Gynecology

## 2013-05-17 VITALS — BP 104/68 | Ht 67.5 in | Wt 218.6 lb

## 2013-05-17 DIAGNOSIS — N926 Irregular menstruation, unspecified: Secondary | ICD-10-CM

## 2013-05-17 DIAGNOSIS — R1031 Right lower quadrant pain: Secondary | ICD-10-CM

## 2013-05-17 DIAGNOSIS — G8929 Other chronic pain: Secondary | ICD-10-CM

## 2013-05-17 NOTE — Patient Instructions (Signed)
Please call if you have any future bleeding, pain issues, or other concerns

## 2013-05-17 NOTE — Progress Notes (Signed)
52 y.o.Marriedfemale here for a pelvic ultrasound.  Patient is perimenopausal with cycles becoming more irregular during the last 12 months.  Has only had three cycles and one of these was Provera induced.  Pt last cycled 05/05/13, on her own. But this was accompanied by RLQ pain requiring OTC medications and heat for relief.  She has some mild anxiety about the pain and wanted evaluation.  Patient's last menstrual period was 05/05/2013.  Sexually active:  no  Contraception: bilateral tubal ligation  FINDINGS: UTERUS:  6.3 x 5.8 x 5.0cm with no fibroids EMS: 4.2 mm ADNEXA:   Left ovary 2.7 x 1.3 x 1.0 cm with two follicles 12mm and 9 mm   Right ovary 1.9 x 1.0 x 0.7cm CUL DE SAC: no free fluid  Images reviewed with patient and results discussed.  Patient reassured that only finding is some small ovarian follicles.  Assessment:  RLQ pain, perimenopausal, H/O DVT after foot fracture  Plan: Pt knows to call if has pain again, cycles again, or if she has to take Provera again in 90 days to induce cycle.  Already has medication on-hand from Shirlyn Goltz, NP.    ~15 minutes spent with patient >50% of time was in face to face discussion of above.

## 2013-05-20 NOTE — Progress Notes (Signed)
Encounter reviewed by Dr. Rylee Nuzum Silva.  

## 2013-06-06 ENCOUNTER — Ambulatory Visit (INDEPENDENT_AMBULATORY_CARE_PROVIDER_SITE_OTHER): Payer: BC Managed Care – PPO | Admitting: *Deleted

## 2013-06-06 DIAGNOSIS — Z23 Encounter for immunization: Secondary | ICD-10-CM

## 2013-06-28 ENCOUNTER — Telehealth: Payer: Self-pay | Admitting: Nurse Practitioner

## 2013-06-28 NOTE — Telephone Encounter (Signed)
pt has not had a cycle in some time wants to talk with PG

## 2013-06-28 NOTE — Telephone Encounter (Signed)
Last AEX 12-2012 with Patty. Seen for RLQ pain 05-14-13 (Patty) and PUS with Dr Hyacinth Meeker 05-17-13. PUS was negative and last 2 OV notes have LMP of 05-05-13 with Provera.  LMTCB, VM has name confirmation.

## 2013-07-03 NOTE — Telephone Encounter (Signed)
Yes have patient to do a Provera challenge with a return call as to her +/- response.

## 2013-07-03 NOTE — Telephone Encounter (Signed)
Patient returned call. She wants to know if she needs to do a provera challenge. She has provera on hand. Patient has rx for Fluoxetine and wants to change rx to 90 days so that she does not need to go to pharmacy as often.

## 2013-07-04 MED ORDER — FLUOXETINE HCL 10 MG PO TABS: 10.0000 mg | ORAL_TABLET | Freq: Every day | ORAL | Status: DC

## 2013-07-04 NOTE — Telephone Encounter (Signed)
I placed an order for Prozac to dispense for 90 days with 2 refills. Can you sign?

## 2013-07-05 NOTE — Telephone Encounter (Signed)
Message left to return call to [place] at [phone].   Detailed message left on cell phone with name.

## 2013-07-05 NOTE — Telephone Encounter (Signed)
Did she want to do Provera challenge as well?

## 2013-07-12 ENCOUNTER — Other Ambulatory Visit: Payer: Self-pay | Admitting: Physician Assistant

## 2013-09-13 ENCOUNTER — Ambulatory Visit: Payer: BC Managed Care – PPO

## 2013-09-13 ENCOUNTER — Ambulatory Visit (INDEPENDENT_AMBULATORY_CARE_PROVIDER_SITE_OTHER): Payer: BC Managed Care – PPO | Admitting: Physician Assistant

## 2013-09-13 ENCOUNTER — Encounter: Payer: Self-pay | Admitting: Physician Assistant

## 2013-09-13 VITALS — BP 124/82 | HR 64 | Temp 98.6°F | Resp 16 | Ht 67.0 in | Wt 215.0 lb

## 2013-09-13 DIAGNOSIS — G43909 Migraine, unspecified, not intractable, without status migrainosus: Secondary | ICD-10-CM

## 2013-09-13 DIAGNOSIS — M79671 Pain in right foot: Secondary | ICD-10-CM

## 2013-09-13 DIAGNOSIS — I82409 Acute embolism and thrombosis of unspecified deep veins of unspecified lower extremity: Secondary | ICD-10-CM

## 2013-09-13 DIAGNOSIS — M7710 Lateral epicondylitis, unspecified elbow: Secondary | ICD-10-CM

## 2013-09-13 DIAGNOSIS — M79609 Pain in unspecified limb: Secondary | ICD-10-CM

## 2013-09-13 DIAGNOSIS — M549 Dorsalgia, unspecified: Secondary | ICD-10-CM

## 2013-09-13 MED ORDER — BACLOFEN 10 MG PO TABS: 10.0000 mg | ORAL_TABLET | Freq: Three times a day (TID) | ORAL | Status: DC

## 2013-09-13 MED ORDER — MELOXICAM 15 MG PO TABS: 15.0000 mg | ORAL_TABLET | Freq: Every day | ORAL | Status: DC
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
## 2013-09-13 NOTE — Patient Instructions (Signed)
Let me know in 2 weeks whether or not the meloxicam works.

## 2013-09-14 NOTE — Progress Notes (Signed)
I have examined this patient along with the student and agree.  

## 2013-09-19 ENCOUNTER — Encounter: Payer: Self-pay | Admitting: Physician Assistant

## 2013-09-19 DIAGNOSIS — I872 Venous insufficiency (chronic) (peripheral): Secondary | ICD-10-CM

## 2013-10-01 ENCOUNTER — Encounter: Payer: Self-pay | Admitting: Physician Assistant

## 2013-10-04 ENCOUNTER — Other Ambulatory Visit: Payer: Self-pay

## 2013-10-04 DIAGNOSIS — I872 Venous insufficiency (chronic) (peripheral): Secondary | ICD-10-CM

## 2013-10-04 DIAGNOSIS — Z1231 Encounter for screening mammogram for malignant neoplasm of breast: Secondary | ICD-10-CM

## 2013-10-23 ENCOUNTER — Other Ambulatory Visit: Payer: Self-pay | Admitting: Physician Assistant

## 2013-10-24 NOTE — Telephone Encounter (Signed)
Chelle, you just saw pt in Jan, but haven't seen her for allergies in a year. Do you want to give her RFs or RTC for allergy f/up?

## 2013-10-24 NOTE — Telephone Encounter (Signed)
Just saw pt in Jan. Ok to refill Flonase?

## 2013-10-29 ENCOUNTER — Ambulatory Visit
Admission: RE | Admit: 2013-10-29 | Discharge: 2013-10-29 | Disposition: A | Discharge status: Home / Self Care | Payer: Self-pay | Source: Ambulatory Visit

## 2013-10-29 DIAGNOSIS — Z1231 Encounter for screening mammogram for malignant neoplasm of breast: Secondary | ICD-10-CM

## 2013-11-09 ENCOUNTER — Encounter (HOSPITAL_COMMUNITY): Payer: BC Managed Care – PPO

## 2013-11-09 ENCOUNTER — Encounter: Payer: BC Managed Care – PPO | Admitting: Vascular Surgery

## 2014-01-07 ENCOUNTER — Encounter: Payer: Self-pay | Admitting: Nurse Practitioner

## 2014-01-07 ENCOUNTER — Ambulatory Visit (INDEPENDENT_AMBULATORY_CARE_PROVIDER_SITE_OTHER): Payer: BC Managed Care – PPO | Admitting: Family Medicine

## 2014-01-07 ENCOUNTER — Ambulatory Visit (INDEPENDENT_AMBULATORY_CARE_PROVIDER_SITE_OTHER): Payer: BC Managed Care – PPO | Admitting: Nurse Practitioner

## 2014-01-07 VITALS — BP 100/70 | HR 75 | Resp 16 | Ht 68.0 in | Wt 220.4 lb

## 2014-01-07 VITALS — BP 100/64 | HR 72 | Ht 68.0 in | Wt 220.0 lb

## 2014-01-07 DIAGNOSIS — J209 Acute bronchitis, unspecified: Secondary | ICD-10-CM

## 2014-01-07 DIAGNOSIS — Z01419 Encounter for gynecological examination (general) (routine) without abnormal findings: Secondary | ICD-10-CM

## 2014-01-07 DIAGNOSIS — Z0000 Encounter for general adult medical examination without abnormal findings: Secondary | ICD-10-CM

## 2014-01-07 LAB — POCT URINALYSIS DIPSTICK
Bilirubin, UA: NEGATIVE
Blood, UA: NEGATIVE
Glucose, UA: NEGATIVE
KETONES UA: NEGATIVE
Leukocytes, UA: NEGATIVE
Nitrite, UA: NEGATIVE
PROTEIN UA: NEGATIVE
Urobilinogen, UA: NEGATIVE
pH, UA: 5

## 2014-01-07 LAB — COMPREHENSIVE METABOLIC PANEL
ALT: 14 U/L (ref 0–35)
AST: 17 U/L (ref 0–37)
Albumin: 4.2 g/dL (ref 3.5–5.2)
Alkaline Phosphatase: 71 U/L (ref 39–117)
BUN: 12 mg/dL (ref 6–23)
CO2: 28 meq/L (ref 19–32)
CREATININE: 1 mg/dL (ref 0.50–1.10)
Calcium: 9.3 mg/dL (ref 8.4–10.5)
Chloride: 101 mEq/L (ref 96–112)
Glucose, Bld: 82 mg/dL (ref 70–99)
Potassium: 5.1 mEq/L (ref 3.5–5.3)
SODIUM: 139 meq/L (ref 135–145)
TOTAL PROTEIN: 6.7 g/dL (ref 6.0–8.3)
Total Bilirubin: 0.4 mg/dL (ref 0.2–1.2)

## 2014-01-07 LAB — HEMOGLOBIN, FINGERSTICK: HEMOGLOBIN, FINGERSTICK: 12.9 g/dL (ref 12.0–16.0)

## 2014-01-07 MED ORDER — FLUOXETINE HCL 10 MG PO TABS: ORAL_TABLET | ORAL | Status: DC

## 2014-01-07 MED ORDER — AZITHROMYCIN 250 MG PO TABS: ORAL_TABLET | ORAL | Status: DC

## 2014-01-07 MED ORDER — HYDROCOD POLST-CHLORPHEN POLST 10-8 MG/5ML PO LQCR: 5.0000 mL | Freq: Two times a day (BID) | ORAL | Status: DC | PRN

## 2014-01-07 NOTE — Progress Notes (Signed)
Subjective:    Patient ID: Marcial Pacasnn Madl, female    DOB: [birth year], 53 y.o.   MRN: [id number] This chart was scribed for Norberto SorensonEva Shaw, MD by Evon Slackerrance Branch, ED Scribe. This Patient was seen in room 04 and the patients care was started at 5:10 PM Chief Complaint  Patient presents with  . Cough    x 1 and 1/2 weeks    HPI Pt presents to the urgent care and states she has constant cough onset 10 days prev. She states that in the morning the cough is productive of beige-colored sputum. She states that by the end of the day her cough is dry. She states she has associated subjective fever, rhinorrhea, sinus pressure, and congestion. She states she takes several allergy medicines regularly - zyrtec qhs and flonase qam w/ reg mucinex. None of the allergy medications relieved  her of her symptoms. She states she started using delsym yesterday with no relief of symptoms. Is not sleeping due to cough.  Started to feel better initially until secondary worsening 4d prior and no improvement since.  Denies chill, sore throat, sob, appetite change, or wheezing.    Review of Systems  Constitutional: Positive for fever and fatigue. Negative for activity change and appetite change.  HENT: Positive for rhinorrhea and sinus pressure. Negative for sore throat.   Respiratory: Positive for cough. Negative for shortness of breath and wheezing.   Gastrointestinal: Negative for nausea, vomiting and abdominal pain.  Allergic/Immunologic: Positive for environmental allergies.  Hematological: Negative for adenopathy.  Psychiatric/Behavioral: Positive for sleep disturbance.       Objective:   Physical Exam  Nursing note and vitals reviewed. Constitutional: She is oriented to person, place, and time. She appears well-developed and well-nourished. No distress.  HENT:  Head: Normocephalic and atraumatic.  Right Ear: External ear and ear canal normal. A middle ear effusion is present.  Left Ear: External ear and ear  canal normal. A middle ear effusion is present.  Nose: Nose normal.  Mouth/Throat: Oropharynx is clear and moist.  Both TM has mid ear effusion but otherwise normal  Eyes: EOM are normal.  Neck: Neck supple.  Cardiovascular: Normal rate, regular rhythm and normal heart sounds.   No murmur heard. Pulmonary/Chest: Effort normal and breath sounds normal. No respiratory distress. She has no wheezes. She has no rales.  Musculoskeletal: Normal range of motion.  Lymphadenopathy:    She has no cervical adenopathy.  Neurological: She is alert and oriented to person, place, and time.  Skin: Skin is warm and dry.  Psychiatric: She has a normal mood and affect. Her behavior is normal.      Assessment & Plan:    5:18 PM  Acute bronchitis Suspect viral so pt counseled not fill Zpack unless secondary worsening. Hopefully will improve with cough suppression and sleep.  Cont zyrtec, mucinex, and flonase. Meds ordered this encounter  Medications  . azithromycin (ZITHROMAX) 250 MG tablet    Sig: Take 2 tabs PO x 1 dose, then 1 tab PO QD x 4 days    Dispense:  6 tablet    Refill:  0  . chlorpheniramine-HYDROcodone (TUSSIONEX PENNKINETIC ER) 10-8 MG/5ML LQCR    Sig: Take 5 mLs by mouth every 12 (twelve) hours as needed.    Dispense:  120 mL    Refill:  0    I personally performed the services described in this documentation, which was scribed in my presence. The recorded information has been reviewed and considered, and  addended by me as needed.  Norberto SorensonEva Shaw, MD MPH

## 2014-01-07 NOTE — Progress Notes (Signed)
Patient ID: Katherine Adams, female   DOB: 1960-11-10, 53 y.o.   MRN: [id number] 53 y.o. [id number] Married Caucasian Fe here for annual exam. LMP prior to this year on her own without Provera was August 2013.  In 2014 took Provera challenge May and September with a withdrawal bleed.   She was to repeat the challenge in November - but did not secondary to life and Holiday events.  She did not want to have to deal with heavy or prolonged bleeding. Now in 2015 she had a cycle on her own on  April 1 -9.   Vaginal bleeding for 3 days heavy and using ultra tampon changing every 3 hours, then 2 days of moderate, then light.   She also noted random but frequent "resting palpitations".   Then again noted a few days of PMS and bloating symptoms.   She then started a new cycle on April 24 - May 2.  Heavy for 3 days super tampon changing every 3 hours. There was 2 days of moderate to light.  No further palpitations.    Patient's last menstrual period was 12/21/2013.          Sexually active: yes  The current method of family planning is tubal ligation.    Exercising: no  The patient does not participate in regular exercise at present. Smoker:  no  Health Maintenance: Pap:  10/12/2011  Normal with negative HR HPV MMG:  10/29/13, Bi-Rads 1: negative  Colonoscopy:  02/2013, inconclusive, recommended repeat in 1 year, pt is not scheduling at this time BMD:   09/2010 normal TDaP:  Unsure. Up to date, per pt.   Labs:  HB:  12.9  Urine:  Negative   reports that she has never smoked. She has never used smokeless tobacco. She reports that she drinks about .5 ounces of alcohol per week. She reports that she does not use illicit drugs.  Past Medical History  Diagnosis Date  . Allergic rhinitis, cause unspecified   . Onychomycosis 2012  . Headache(784.0)     migraines  . DVT (deep venous thrombosis) 02/25/2010    Bilateral LE, after stress fx of foot  . Acne vulgaris   . DJD (degenerative joint disease) of knee     Right  knee  . Bursitis 2004    left hip  . Scoliosis 2004    mild  . Plantar fasciitis 2004    bilateral    Past Surgical History  Procedure Laterality Date  . Tubal ligation Bilateral 11/1994  . Tonsillectomy and adenoidectomy    . Wisdom tooth extraction  age 89    and removal of 4 other teeth - braces X 2    Current Outpatient Prescriptions  Medication Sig Dispense Refill  . aspirin EC 81 MG tablet Take 81 mg by mouth daily.      . baclofen (LIORESAL) 10 MG tablet Take 1 tablet (10 mg total) by mouth 3 (three) times daily.  30 each  3  . calcium carbonate (OS-CAL) 600 MG TABS Take 600 mg by mouth 2 (two) times daily with a meal.        . cetirizine (ZYRTEC) 10 MG tablet Take 10 mg by mouth daily.        . Cholecalciferol (VITAMIN D) 2000 UNITS tablet Take 2,000 Units by mouth 2 (two) times daily.      Marland Kitchen. docusate sodium (COLACE) 100 MG capsule Take 100 mg by mouth 2 (two) times daily.      .Marland Kitchen  FLUoxetine (PROZAC) 10 MG tablet 1 tablet a day, may add extra tablet prn as needed  180 tablet  3  . fluticasone (FLONASE) 50 MCG/ACT nasal spray USE 2 SPRAYS IN EACH NOSTRIL DAILY AS NEEDED FOR ALLERGIES  16 g  10  . glucosamine-chondroitin 500-400 MG tablet Take 1 tablet by mouth 2 (two) times daily.      Marland Kitchen. KRILL OIL 1000 MG CAPS Take by mouth daily.      . magnesium gluconate (MAGONATE) 500 MG tablet Take 500 mg by mouth daily.        . Multiple Vitamins-Minerals (MULTIVITAMIN PO) Take by mouth daily.        Marland Kitchen. spironolactone (ALDACTONE) 50 MG tablet Take 50 mg by mouth daily.        Marland Kitchen. azithromycin (ZITHROMAX) 250 MG tablet Take 2 tabs PO x 1 dose, then 1 tab PO QD x 4 days  6 tablet  0  . chlorpheniramine-HYDROcodone (TUSSIONEX PENNKINETIC ER) 10-8 MG/5ML LQCR Take 5 mLs by mouth every 12 (twelve) hours as needed.  120 mL  0  . medroxyPROGESTERone (PROVERA) 10 MG tablet Take 1 tablet (10 mg total) by mouth daily.  10 tablet  0  . meloxicam (MOBIC) 15 MG tablet Take 1 tablet (15 mg total) by  mouth daily.  30 tablet  1   No current facility-administered medications for this visit.    Family History  Problem Relation Age of Onset  . Diabetes Maternal Aunt   . Diabetes Mother   . Heart disease Mother   . Thyroid disease Mother   . Hyperlipidemia Mother   . Hyperlipidemia Father   . Diabetes Maternal Aunt   . Stroke Maternal Grandmother   . Stroke Maternal Grandfather   . COPD Paternal Grandfather     ROS:  Pertinent items are noted in HPI.  Otherwise, a comprehensive ROS was negative.  Exam:   BP 100/64  Pulse 72  Ht 5\' 8"  (1.727 m)  Wt 220 lb (99.791 kg)  BMI 33.46 kg/m2  LMP 12/21/2013 Height: 5\' 8"  (172.7 cm)  Ht Readings from Last 3 Encounters:  01/07/14 5\' 8"  (1.727 m)  01/07/14 5\' 8"  (1.727 m)  09/13/13 5\' 7"  (1.702 m)    General appearance: alert, cooperative and appears stated age Head: Normocephalic, without obvious abnormality, atraumatic Neck: no adenopathy, supple, symmetrical, trachea midline and thyroid normal to inspection and palpation Lungs: clear to auscultation bilaterally Breasts: normal appearance, no masses or tenderness Heart: regular rate and rhythm Abdomen: soft, non-tender; no masses,  no organomegaly Extremities: extremities normal, atraumatic, no cyanosis or edema Skin: Skin color, texture, turgor normal. No rashes or lesions Lymph nodes: Cervical, supraclavicular, and axillary nodes normal. No abnormal inguinal nodes palpated Neurologic: Grossly normal   Pelvic: External genitalia:  no lesions              Urethra:  normal appearing urethra with no masses, tenderness or lesions              Bartholin's and Skene's: normal                 Vagina: normal appearing vagina with normal color and discharge, no lesions              Cervix: anteverted              Pap taken: no Bimanual Exam:  Uterus:  normal size, contour, position, consistency, mobility, non-tender  Adnexa: no mass, fullness, tenderness                Rectovaginal: Confirms               Anus:  normal sphincter tone, no lesions  A:  Well Woman with normal exam  Perimenopausal still with irregular cycles   Continue to monitor and if no menses will do Provera challenge mid to end of July.  P:   Reviewed health and wellness pertinent to exam  Pap smear not taken today  Mammogram is due 3/16  She has Rx on hand for Provera  Counseled on breast self exam, mammography screening, adequate intake of calcium and vitamin D, diet and exercise, Kegel's exercises return annually or prn  An After Visit Summary was printed and given to the patient.

## 2014-01-07 NOTE — Patient Instructions (Signed)
Acute Bronchitis Bronchitis is inflammation of the airways that extend from the windpipe into the lungs (bronchi). The inflammation often causes mucus to develop. This leads to a cough, which is the most common symptom of bronchitis.  In acute bronchitis, the condition usually develops suddenly and goes away over time, usually in a couple weeks. Smoking, allergies, and asthma can make bronchitis worse. Repeated episodes of bronchitis may cause further lung problems.  CAUSES Acute bronchitis is most often caused by the same virus that causes a cold. The virus can spread from person to person (contagious).  SIGNS AND SYMPTOMS   Cough.   Fever.   Coughing up mucus.   Body aches.   Chest congestion.   Chills.   Shortness of breath.   Sore throat.  DIAGNOSIS  Acute bronchitis is usually diagnosed through a physical exam. Tests, such as chest X-rays, are sometimes done to rule out other conditions.  TREATMENT  Acute bronchitis usually goes away in a couple weeks. Often times, no medical treatment is necessary. Medicines are sometimes given for relief of fever or cough. Antibiotics are usually not needed but may be prescribed in certain situations. In some cases, an inhaler may be recommended to help reduce shortness of breath and control the cough. A cool mist vaporizer may also be used to help thin bronchial secretions and make it easier to clear the chest.  HOME CARE INSTRUCTIONS  Get plenty of rest.   Drink enough fluids to keep your urine clear or pale yellow (unless you have a medical condition that requires fluid restriction). Increasing fluids may help thin your secretions and will prevent dehydration.   Only take over-the-counter or prescription medicines as directed by your health care provider.   Avoid smoking and secondhand smoke. Exposure to cigarette smoke or irritating chemicals will make bronchitis worse. If you are a smoker, consider using nicotine gum or skin  patches to help control withdrawal symptoms. Quitting smoking will help your lungs heal faster.   Reduce the chances of another bout of acute bronchitis by washing your hands frequently, avoiding people with cold symptoms, and trying not to touch your hands to your mouth, nose, or eyes.   Follow up with your health care provider as directed.  SEEK MEDICAL CARE IF: Your symptoms do not improve after 1 week of treatment.  SEEK IMMEDIATE MEDICAL CARE IF:  You develop an increased fever or chills.   You have chest pain.   You have severe shortness of breath.  You have bloody sputum.   You develop dehydration.  You develop fainting.  You develop repeated vomiting.  You develop a severe headache. MAKE SURE YOU:   Understand these instructions.  Will watch your condition.  Will get help right away if you are not doing well or get worse. Document Released: 09/23/2004 Document Revised: 04/18/2013 Document Reviewed: 02/06/2013 [employer] Patient Information 2014 [employer].  

## 2014-01-07 NOTE — Patient Instructions (Signed)

## 2014-01-08 LAB — VITAMIN D 25 HYDROXY (VIT D DEFICIENCY, FRACTURES): VIT D 25 HYDROXY: 61 ng/mL (ref 30–89)

## 2014-01-09 NOTE — Progress Notes (Signed)
Encounter reviewed by Dr. Brook Silva.  

## 2014-04-16 ENCOUNTER — Telehealth: Payer: Self-pay | Admitting: Nurse Practitioner

## 2014-04-16 NOTE — Telephone Encounter (Signed)
Spoke with patient. Patient states that she has been taking 10mg  of Prozac a day. Would like to know how to taper off of this medication. Advised would send a message to Lauro FranklinPatricia Rolen-Grubb, FNP and give patient a call back with further instructions. Patient agreeable.

## 2014-04-16 NOTE — Telephone Encounter (Signed)
Pt has questions regarding the generic prozac and how to cut back on taking it.

## 2014-04-16 NOTE — Telephone Encounter (Signed)
Since Prozac has a long half life the taper can be done easily.  She is already on a low dose at 10 mg daily.  Have her to take 1 tablet every other day for 2 weeks.  The go to every 3 days for 2 weeks, then go to every 5 days for 2 weeks.  At last go to every 7 days for 2 weeks then stop.  If during this time she is not doing well go back to the previous dosing for a week and then back to taper dosing.

## 2014-04-17 NOTE — Telephone Encounter (Signed)
Spoke with patient. Advised of message as seen below from Patricia Rolen-Grubb, FNP. Patient agreeable and verbalizes understanding.  Routing to provider for final review. Patient agreeable to disposition. Will close encounter   

## 2014-04-17 NOTE — Telephone Encounter (Signed)
Left message to call [name] at [phone]. 

## 2014-04-17 NOTE — Telephone Encounter (Signed)
Pt is returning a call to [name] °

## 2014-05-05 ENCOUNTER — Ambulatory Visit (INDEPENDENT_AMBULATORY_CARE_PROVIDER_SITE_OTHER): Payer: BC Managed Care – PPO

## 2014-05-05 ENCOUNTER — Ambulatory Visit (INDEPENDENT_AMBULATORY_CARE_PROVIDER_SITE_OTHER): Payer: BC Managed Care – PPO | Admitting: Emergency Medicine

## 2014-05-05 VITALS — BP 106/72 | HR 62 | Temp 98.1°F | Resp 20 | Ht 67.5 in | Wt 220.2 lb

## 2014-05-05 DIAGNOSIS — R079 Chest pain, unspecified: Secondary | ICD-10-CM

## 2014-05-05 DIAGNOSIS — I8390 Asymptomatic varicose veins of unspecified lower extremity: Secondary | ICD-10-CM

## 2014-05-05 MED ORDER — HYDROCODONE-ACETAMINOPHEN 5-325 MG PO TABS: ORAL_TABLET | ORAL | Status: DC

## 2014-05-05 NOTE — Progress Notes (Signed)
   Subjective:    Patient ID: Katherine Adams, female    DOB: Jun 21, 1961, 53 y.o.   MRN: [id number]  HPI patient was in her usual state of health until yesterday when she decided to clean the furniture.. Yesterday evening she developed a 5-6/10 pain in the center of her chest.. this is not associated with any nausea shortness of breath diaphoresis her radiation into her neck or shoulder. The pain is in the center of her chest and this area is very tender to touch. She tried some baclofen without improvement she does not smoke she has not drank she has no family history of early coronary disease there is no history of hypertension high cholesterol  or diabetes     Review of Systems     Objective:   Physical Exam  Constitutional: She appears well-developed and well-nourished.  HENT:  Head: Normocephalic.  Eyes: Pupils are equal, round, and reactive to light.  Neck: No thyromegaly present.  Cardiovascular: Normal rate, regular rhythm and normal heart sounds.  Exam reveals no gallop and no friction rub.   No murmur heard. There is significant tenderness to light touch over the sternomanubrial joint. There is also tenderness at the attachment of the second and third ribs the sternum.  Pulmonary/Chest: Effort normal and breath sounds normal. No respiratory distress. She has no wheezes. She has no rales.  Abdominal: Soft. She exhibits no distension. There is no tenderness. There is no rebound.   EKG normal sinus rhythm [facility] reading (PRIMARY) by  Dr Cleta Alberts no acute disease normal size heart  Meds ordered this encounter  Medications  . HYDROcodone-acetaminophen (NORCO) 5-325 MG per tablet    Sig: Half tablet every 6-8 hours as needed for pain    Dispense:  20 tablet    Refill:  0       Assessment & Plan:  Examination is consistent with musculoskeletal pain. We'll treat with hydrocodone to see if we can get her some relief

## 2014-05-05 NOTE — Patient Instructions (Signed)

## 2014-06-03 ENCOUNTER — Ambulatory Visit (INDEPENDENT_AMBULATORY_CARE_PROVIDER_SITE_OTHER): Payer: BC Managed Care – PPO

## 2014-06-03 ENCOUNTER — Telehealth: Payer: Self-pay

## 2014-06-03 ENCOUNTER — Ambulatory Visit (INDEPENDENT_AMBULATORY_CARE_PROVIDER_SITE_OTHER): Payer: BC Managed Care – PPO | Admitting: Physician Assistant

## 2014-06-03 VITALS — BP 110/74 | HR 72 | Temp 98.5°F | Resp 16 | Ht 69.0 in | Wt 223.0 lb

## 2014-06-03 DIAGNOSIS — M542 Cervicalgia: Secondary | ICD-10-CM

## 2014-06-03 DIAGNOSIS — G43909 Migraine, unspecified, not intractable, without status migrainosus: Secondary | ICD-10-CM

## 2014-06-03 DIAGNOSIS — Z23 Encounter for immunization: Secondary | ICD-10-CM

## 2014-06-03 MED ORDER — BACLOFEN 10 MG PO TABS
10.0000 mg | ORAL_TABLET | Freq: Three times a day (TID) | ORAL | Status: DC
Start: 2014-06-03 — End: 2014-12-24

## 2014-06-03 NOTE — Telephone Encounter (Signed)
Pt would like to know if she will need an OV in order to receive a refill on baclofen. Best#[phone]

## 2014-06-03 NOTE — Telephone Encounter (Signed)
LM for pt to RTC-  

## 2014-06-03 NOTE — Progress Notes (Signed)
Subjective:    Patient ID: Marcial Pacasnn Poyer, female    DOB: [birth year], 53 y.o.   MRN: [id number]   PCP: No PCP Per Patient  Chief Complaint  Patient presents with  . Neck Pain  . Medication Refill    Baclofen  . Immunizations    Flu, Tetnus    Medications, allergies, past medical history, surgical history, family history, social history and problem list reviewed and updated.  Patient Active Problem List   Diagnosis Date Noted  . Seasonal allergies   . DVT (deep venous thrombosis)   . DJD (degenerative joint disease) of knee     Prior to Admission medications   Medication Sig Start Date End Date Taking? Authorizing Provider  aspirin EC 81 MG tablet Take 81 mg by mouth daily.   Yes Historical Provider, MD  baclofen (LIORESAL) 10 MG tablet Take 1 tablet (10 mg total) by mouth 3 (three) times daily. 09/13/13  Yes Maidie Streight S Avira Tillison, PA-C  calcium carbonate (OS-CAL) 600 MG TABS Take 600 mg by mouth 2 (two) times daily with a meal.     Yes Historical Provider, MD  cetirizine (ZYRTEC) 10 MG tablet Take 10 mg by mouth daily.     Yes Historical Provider, MD  Cholecalciferol (VITAMIN D) 2000 UNITS tablet Take 2,000 Units by mouth 2 (two) times daily.   Yes Historical Provider, MD  docusate sodium (COLACE) 100 MG capsule Take 100 mg by mouth 2 (two) times daily.   Yes Historical Provider, MD  FLUoxetine (PROZAC) 10 MG tablet 1 tablet a day, may add extra tablet prn as needed 01/07/14  Yes Patricia Rolen-Grubb, FNP  fluticasone (FLONASE) 50 MCG/ACT nasal spray USE 2 SPRAYS IN EACH NOSTRIL DAILY AS NEEDED FOR ALLERGIES   Yes Sameena Artus S Taahir Grisby, PA-C  glucosamine-chondroitin 500-400 MG tablet Take 1 tablet by mouth 2 (two) times daily.   Yes Historical Provider, MD  KRILL OIL 1000 MG CAPS Take by mouth daily.   Yes Historical Provider, MD  magnesium gluconate (MAGONATE) 500 MG tablet Take 500 mg by mouth daily.     Yes Historical Provider, MD  Multiple Vitamins-Minerals (MULTIVITAMIN PO) Take by  mouth daily.     Yes Historical Provider, MD  spironolactone (ALDACTONE) 50 MG tablet Take 25 mg by mouth daily. 25 mg   Yes Historical Provider, MD  medroxyPROGESTERone (PROVERA) 10 MG tablet Take 1 tablet (10 mg total) by mouth daily. 04/26/13   Lauro FranklinPatricia Rolen-Grubb, FNP    HPI  Presents with continued neck pain.  Initially seen in February with this complaint.  We decided to try NSAIDS, as her pain was intermittent and mild.  However, it's becoming more frequent and bothersome, and she's ready to do some PT for it.  She also needs a refill of Baclofen for migraines.  Review of Systems As above.    Objective:   Physical Exam  Vitals reviewed. Constitutional: She is oriented to person, place, and time. Vital signs are normal. She appears well-developed and well-nourished. She is active and cooperative. No distress.  BP 110/74  Pulse 72  Temp(Src) 98.5 F (36.9 C) (Oral)  Resp 16  Ht 5\' 9"  (1.753 m)  Wt 223 lb (101.152 kg)  BMI 32.92 kg/m2  SpO2 97%  HENT:  Head: Normocephalic and atraumatic.  Right Ear: Hearing normal.  Left Ear: Hearing normal.  Eyes: Conjunctivae are normal. No scleral icterus.  Neck: Normal range of motion. Neck supple. No thyromegaly present.  Cardiovascular: Normal rate, regular rhythm and  normal heart sounds.   Pulses:      Radial pulses are 2+ on the right side, and 2+ on the left side.  Pulmonary/Chest: Effort normal and breath sounds normal.  Musculoskeletal:       Cervical back: She exhibits tenderness and pain. She exhibits normal range of motion, no bony tenderness, no swelling and no spasm.       Back:  Lymphadenopathy:       Head (right side): No tonsillar, no preauricular, no posterior auricular and no occipital adenopathy present.       Head (left side): No tonsillar, no preauricular, no posterior auricular and no occipital adenopathy present.    She has no cervical adenopathy.       Right: No supraclavicular adenopathy present.        Left: No supraclavicular adenopathy present.  Neurological: She is alert and oriented to person, place, and time. No sensory deficit.  Skin: Skin is warm, dry and intact. No rash noted. No cyanosis or erythema. Nails show no clubbing.  Psychiatric: She has a normal mood and affect.     C-Spine: [facility] reading (PRIMARY) by  Dr. Alwyn Ren. Degenernative changes C3-C6. No acute bony deformity.       Assessment & Plan:  1. Neck pain on right side degenerative changes. Proceed with PT at [facility], where she's had a good experience previously. - DG Cervical Spine Complete; Future - Ambulatory referral to Physical Therapy  2. Need for influenza vaccination - Flu Vaccine QUAD 36+ mos IM  3. Need for Tdap vaccination - Tdap vaccine greater than or equal to 7yo IM  4. Migraine without status migrainosus, not intractable, unspecified migraine type Stable. - baclofen (LIORESAL) 10 MG tablet; Take 1 tablet (10 mg total) by mouth 3 (three) times daily.  Dispense: 30 each; Refill: 3   Fernande Bras, PA-C Physician Assistant-Certified [facility]

## 2014-06-03 NOTE — Progress Notes (Deleted)
   Subjective:    Patient ID: Katherine Adams, female    DOB: 01/03/1961, 53 y.o.   MRN: [id number]  HPI    Review of Systems     Objective:   Physical Exam        Assessment & Plan:

## 2014-06-17 ENCOUNTER — Telehealth: Payer: Self-pay | Admitting: Nurse Practitioner

## 2014-06-17 NOTE — Telephone Encounter (Signed)
Patient returning call.

## 2014-06-17 NOTE — Telephone Encounter (Signed)
Patient calling with questions about how to restart her: FLUoxetine (PROZAC) 10 MG tablet  1 tablet a day, may add extra tablet prn as needed, Normal, Last Dose: Not Recorded  Refills: 3 ordered Pharmacy: [id number] - [place], [place] - [address]

## 2014-06-17 NOTE — Telephone Encounter (Signed)
Return call to patietn, Katherine Adams. 

## 2014-06-18 NOTE — Telephone Encounter (Addendum)
Patient attempted to dc Prozac 03/2014. Patient have some family stress and would like to restart. Asking if she can restart and at what dose.  Advised per Lauro FranklinPatricia Rolen-Grubb, FNP can start with prozac 10 mg po daily. Call back with update or any further concerns.

## 2014-06-18 NOTE — Telephone Encounter (Signed)
Message left to return call to Chrishawn Boley at [phone].    

## 2014-08-21 ENCOUNTER — Ambulatory Visit (INDEPENDENT_AMBULATORY_CARE_PROVIDER_SITE_OTHER): Payer: BC Managed Care – PPO | Admitting: Physician Assistant

## 2014-08-21 VITALS — BP 124/78 | HR 80 | Temp 98.0°F | Resp 18 | Ht 67.5 in | Wt 217.0 lb

## 2014-08-21 DIAGNOSIS — J019 Acute sinusitis, unspecified: Secondary | ICD-10-CM

## 2014-08-21 MED ORDER — PREDNISONE 20 MG PO TABS: ORAL_TABLET | ORAL | Status: DC

## 2014-08-21 MED ORDER — HYDROCOD POLST-CHLORPHEN POLST 10-8 MG/5ML PO LQCR: 5.0000 mL | Freq: Two times a day (BID) | ORAL | Status: DC | PRN

## 2014-08-21 MED ORDER — AMOXICILLIN-POT CLAVULANATE 875-125 MG PO TABS: 1.0000 | ORAL_TABLET | Freq: Two times a day (BID) | ORAL | Status: AC

## 2014-08-21 NOTE — Progress Notes (Signed)
Subjective:    Patient ID: Katherine Adams, female    DOB: [birth year], 53 y.o.   MRN: [id number]   PCP: No PCP Per Patient  Chief Complaint  Patient presents with  . URI    x10 days   . Cough    greenish mucous   . Nasal Congestion    Allergies  Allergen Reactions  . Levofloxacin Other (See Comments)    Joint pain, fluid collection  . Tape Itching and Other (See Comments)    redness  . Tramadol Nausea And Vomiting  . Triptans     hypercoagulability  . Naproxen Sodium Rash    Patient Active Problem List   Diagnosis Date Noted  . Seasonal allergies   . DVT (deep venous thrombosis)   . DJD (degenerative joint disease) of knee     Prior to Admission medications   Medication Sig Start Date End Date Taking? Authorizing Provider  aspirin EC 81 MG tablet Take 81 mg by mouth daily.   Yes Historical Provider, MD  baclofen (LIORESAL) 10 MG tablet Take 1 tablet (10 mg total) by mouth 3 (three) times daily. 06/03/14  Yes Burtis Imhoff S Beldon Nowling, PA-C  calcium carbonate (OS-CAL) 600 MG TABS Take 600 mg by mouth 2 (two) times daily with a meal.     Yes Historical Provider, MD  cetirizine (ZYRTEC) 10 MG tablet Take 10 mg by mouth daily.     Yes Historical Provider, MD  Cholecalciferol (VITAMIN D) 2000 UNITS tablet Take 2,000 Units by mouth 2 (two) times daily.   Yes Historical Provider, MD  docusate sodium (COLACE) 100 MG capsule Take 100 mg by mouth 2 (two) times daily.   Yes Historical Provider, MD  FLUoxetine (PROZAC) 10 MG tablet 1 tablet a day, may add extra tablet prn as needed 01/07/14  Yes Patricia Rolen-Grubb, FNP  fluticasone (FLONASE) 50 MCG/ACT nasal spray USE 2 SPRAYS IN EACH NOSTRIL DAILY AS NEEDED FOR ALLERGIES   Yes Kourtni Stineman S Yuette Putnam, PA-C  glucosamine-chondroitin 500-400 MG tablet Take 1 tablet by mouth 2 (two) times daily.   Yes Historical Provider, MD  KRILL OIL 1000 MG CAPS Take by mouth daily.   Yes Historical Provider, MD  magnesium gluconate (MAGONATE) 500 MG tablet Take  500 mg by mouth daily.     Yes Historical Provider, MD  Multiple Vitamins-Minerals (MULTIVITAMIN PO) Take by mouth daily.     Yes Historical Provider, MD  spironolactone (ALDACTONE) 50 MG tablet Take 25 mg by mouth daily. 25 mg   Yes Historical Provider, MD  medroxyPROGESTERone (PROVERA) 10 MG tablet Take 1 tablet (10 mg total) by mouth daily. Patient not taking: Reported on 08/21/2014 04/26/13   Lauro FranklinPatricia Rolen-Grubb, FNP    Medical, Surgical, Family and Social History reviewed and updated.  HPI  10 days of illness. Sinus pressure and congestion, drippy nose, cough producing green sputum. Chest wall is sore with coughing. Cough triggered by long talking and exertion. Mucinex has helped some. Her husband and daughter had similar illness last week, now improved.  Subjective fever/chills, not supported by temperature measurements. No N/V. No diarrhea. No unexplained myalgias/arthralgias.  Review of Systems As above.    Objective:   Physical Exam  Constitutional: She is oriented to person, place, and time. Vital signs are normal. She appears well-developed and well-nourished. She is active and cooperative. No distress.  HENT:  Head: Normocephalic and atraumatic.  Right Ear: Hearing, tympanic membrane, external ear and ear canal normal.  Left Ear: Hearing, tympanic membrane,  external ear and ear canal normal.  Nose: Mucosal edema and rhinorrhea present.  No foreign bodies.  Mouth/Throat: Uvula is midline, oropharynx is clear and moist and mucous membranes are normal. No uvula swelling. No oropharyngeal exudate.  Eyes: Conjunctivae and EOM are normal. Pupils are equal, round, and reactive to light. Right eye exhibits no discharge. Left eye exhibits no discharge. No scleral icterus.  Neck: Trachea normal, normal range of motion and full passive range of motion without pain. Neck supple. No thyroid mass and no thyromegaly present.  Cardiovascular: Normal rate, regular rhythm and normal  heart sounds.   Pulmonary/Chest: Effort normal and breath sounds normal.  Lymphadenopathy:       Head (right side): No submandibular, no tonsillar, no preauricular, no posterior auricular and no occipital adenopathy present.       Head (left side): No submandibular, no tonsillar, no preauricular and no occipital adenopathy present.    She has no cervical adenopathy.       Right: No supraclavicular adenopathy present.       Left: No supraclavicular adenopathy present.  Neurological: She is alert and oriented to person, place, and time. She has normal strength. No cranial nerve deficit or sensory deficit.  Skin: Skin is warm, dry and intact. No rash noted.  Psychiatric: She has a normal mood and affect. Her speech is normal and behavior is normal.  Vitals reviewed.         Assessment & Plan:  1. Subacute sinusitis, unspecified location Supportive care.  Anticipatory guidance.  RTC if symptoms worsen/persist. - chlorpheniramine-HYDROcodone (TUSSIONEX PENNKINETIC ER) 10-8 MG/5ML LQCR; Take 5 mLs by mouth every 12 (twelve) hours as needed for cough (cough).  Dispense: 100 mL; Refill: 0 - amoxicillin-clavulanate (AUGMENTIN) 875-125 MG per tablet; Take 1 tablet by mouth 2 (two) times daily.  Dispense: 20 tablet; Refill: 0 - predniSONE (DELTASONE) 20 MG tablet; Take 3 PO QAM x3days, 2 PO QAM x3days, 1 PO QAM x3days  Dispense: 18 tablet; Refill: 0   Fernande Brashelle S. Rylyn Ranganathan, PA-C Physician Assistant-Certified [facility]

## 2014-08-21 NOTE — Patient Instructions (Signed)
Get plenty of rest and drink at least 64 ounces of water daily. Continue the Mucinex.

## 2014-11-06 ENCOUNTER — Other Ambulatory Visit: Payer: Self-pay

## 2014-11-06 DIAGNOSIS — Z1231 Encounter for screening mammogram for malignant neoplasm of breast: Secondary | ICD-10-CM

## 2014-11-13 ENCOUNTER — Ambulatory Visit
Admission: RE | Admit: 2014-11-13 | Discharge: 2014-11-13 | Disposition: A | Discharge status: Home / Self Care | Payer: BLUE CROSS/BLUE SHIELD | Source: Ambulatory Visit

## 2014-11-13 DIAGNOSIS — Z1231 Encounter for screening mammogram for malignant neoplasm of breast: Secondary | ICD-10-CM

## 2014-12-19 ENCOUNTER — Telehealth: Payer: Self-pay

## 2014-12-19 ENCOUNTER — Ambulatory Visit (HOSPITAL_COMMUNITY): Payer: BLUE CROSS/BLUE SHIELD | Attending: Internal Medicine

## 2014-12-19 ENCOUNTER — Encounter: Payer: Self-pay | Admitting: Radiology

## 2014-12-19 ENCOUNTER — Ambulatory Visit (INDEPENDENT_AMBULATORY_CARE_PROVIDER_SITE_OTHER): Payer: BLUE CROSS/BLUE SHIELD | Admitting: Family Medicine

## 2014-12-19 VITALS — BP 114/72 | HR 60 | Temp 98.2°F | Resp 20 | Ht 68.0 in | Wt 218.5 lb

## 2014-12-19 DIAGNOSIS — M25572 Pain in left ankle and joints of left foot: Secondary | ICD-10-CM

## 2014-12-19 DIAGNOSIS — M25562 Pain in left knee: Secondary | ICD-10-CM

## 2014-12-19 DIAGNOSIS — Z86718 Personal history of other venous thrombosis and embolism: Secondary | ICD-10-CM | POA: Diagnosis not present

## 2014-12-19 MED ORDER — RIVAROXABAN (XARELTO) VTE STARTER PACK (15 & 20 MG): ORAL_TABLET | ORAL | Status: DC

## 2014-12-19 NOTE — Patient Instructions (Signed)
[facility] and [facility] as [facility].  Do not stop anywhere before going there.  Stay at the hospital until they call the report of your study to us.

## 2014-12-19 NOTE — Telephone Encounter (Signed)
tishira notified about this. Pt will go for doppler in the morning. [facility] in [facility]. lmom of pt's cell to cb and let us know which other pharmacy she would like it called into.

## 2014-12-19 NOTE — Progress Notes (Signed)
   Subjective:    Patient ID: Katherine Adams, female    DOB: September 17, 1960, 54 y.o.   MRN: [id number]  HPI Patient with PMH of bilat DVT 5 years ago presents with left sided ankle pain and calf tenderness. Additionally endorses swelling of ankle and foot. Has been on feet more often for longer periods of time for past 3 weeks for work. Has been wearing compression hose, but those are now painful to put on. Is not in as much pain as she was when she has DVT in the past. No redness or nodules reported. No h/o HTN, dyslipidemia, or DM. Has never smoked.   Review of Systems  Constitutional: Negative for fever.  Respiratory: Negative for shortness of breath and wheezing.   Cardiovascular: Positive for leg swelling. Negative for chest pain and palpitations.  Gastrointestinal: Negative for nausea and vomiting.  Musculoskeletal: Positive for joint swelling. Negative for myalgias, back pain, arthralgias and gait problem.  Neurological: Negative for weakness and numbness.       Objective:   Physical Exam  Constitutional: She is oriented to person, place, and time. She appears well-developed and well-nourished. No distress.  Blood pressure 114/72, pulse 60, temperature 98.2 F (36.8 C), temperature source Oral, resp. rate 20, height 5\' 8"  (1.727 m), weight 218 lb 8 oz (99.111 kg), SpO2 99 %.  HENT:  Head: Normocephalic and atraumatic.  Right Ear: External ear normal.  Left Ear: External ear normal.  Eyes: Conjunctivae are normal. Right eye exhibits no discharge. Left eye exhibits no discharge.  Neck: Neck supple. No thyromegaly present.  Cardiovascular: Normal rate, regular rhythm, normal heart sounds and intact distal pulses.  Exam reveals no gallop and no friction rub.   No murmur heard. Pulmonary/Chest: Effort normal and breath sounds normal. No respiratory distress. She has no wheezes. She has no rales. She exhibits no tenderness.  Abdominal: Soft. Bowel sounds are normal. She exhibits no  distension. There is no tenderness. There is no rebound and no guarding.  Musculoskeletal: Normal range of motion. She exhibits edema (left ankle on medial aspect. No swelling of calf) and tenderness (left medial malleolus and left calf).  Lymphadenopathy:    She has no cervical adenopathy.  Neurological: She is alert and oriented to person, place, and time. She has normal reflexes. No cranial nerve deficit.  Skin: Skin is warm and dry. No rash noted. She is not diaphoretic. No erythema. No pallor.       Assessment & Plan:  1. Pain in joint, lower leg, left 3. Ankle pain, left 2. History of DVT (deep vein thrombosis) Patient will want to switch to Coumadin once dx is confirmed. - US Venous Img Lower Unilateral Left; Future - Rivaroxaban (XARELTO STARTER PACK) 15 & 20 MG TBPK; Take as directed on package: Start with one 15mg  tablet by mouth twice a day with food. On Day 22, switch to one 20mg  tablet once a day with food.  Dispense: 51 each; Refill: 0   Emil Klassen PA-C  [facility] 12/19/2014 7:51 PM

## 2014-12-19 NOTE — Telephone Encounter (Signed)
Per Tishira-- Call in Xarelto 15mg  #10 1 po bid. Called this [facility].

## 2014-12-19 NOTE — Telephone Encounter (Signed)
Patient had a referral today and is calling because the insurance won't cover anything and would like to know if she could be [facility] (she can't remember the name) so that her visit can be covered with just the co payment. Please call! Patient phone: [phone]

## 2014-12-20 ENCOUNTER — Other Ambulatory Visit (HOSPITAL_COMMUNITY): Payer: Self-pay | Admitting: Physician Assistant

## 2014-12-20 ENCOUNTER — Telehealth: Payer: Self-pay | Admitting: Family Medicine

## 2014-12-20 ENCOUNTER — Ambulatory Visit (HOSPITAL_COMMUNITY): Admission: RE | Admit: 2014-12-20 | Payer: BLUE CROSS/BLUE SHIELD | Source: Ambulatory Visit

## 2014-12-20 ENCOUNTER — Ambulatory Visit
Admission: RE | Admit: 2014-12-20 | Discharge: 2014-12-20 | Disposition: A | Discharge status: Home / Self Care | Payer: BLUE CROSS/BLUE SHIELD | Source: Ambulatory Visit | Attending: Physician Assistant | Admitting: Physician Assistant

## 2014-12-20 DIAGNOSIS — M25562 Pain in left knee: Secondary | ICD-10-CM

## 2014-12-20 DIAGNOSIS — M25572 Pain in left ankle and joints of left foot: Secondary | ICD-10-CM

## 2014-12-20 DIAGNOSIS — Z86718 Personal history of other venous thrombosis and embolism: Secondary | ICD-10-CM

## 2014-12-20 NOTE — Telephone Encounter (Signed)
[facility] No evidence of a clot  FINDINGS:  Normal compressibility of the common femoral, superficial femoral,  and popliteal veins, as well as the proximal calf veins. No filling  defects to suggest DVT on grayscale or color Doppler imaging.  Doppler waveforms show normal direction of venous flow, normal  respiratory phasicity and response to augmentation. Visualized  segments of the saphenous venous system normal in caliber and  compressibility. Survey views of the contralateral common femoral  vein are unremarkable.  IMPRESSION:  1. No evidence of lower extremity deep vein thrombosis, LEFT.   At this time she can DC blood thinner, let us know if not better soon or if any other questions

## 2014-12-20 NOTE — Telephone Encounter (Signed)
Pt was notified of doppler that was [facility] this [facility].  Pt understood and stated she would be there.

## 2014-12-23 ENCOUNTER — Encounter: Payer: Self-pay | Admitting: Physician Assistant

## 2014-12-24 ENCOUNTER — Ambulatory Visit (INDEPENDENT_AMBULATORY_CARE_PROVIDER_SITE_OTHER): Payer: BLUE CROSS/BLUE SHIELD

## 2014-12-24 ENCOUNTER — Encounter: Payer: Self-pay | Admitting: Physician Assistant

## 2014-12-24 ENCOUNTER — Ambulatory Visit (INDEPENDENT_AMBULATORY_CARE_PROVIDER_SITE_OTHER): Payer: BLUE CROSS/BLUE SHIELD | Admitting: Physician Assistant

## 2014-12-24 VITALS — BP 116/84 | HR 71 | Temp 98.0°F | Resp 16 | Ht 67.5 in | Wt 217.2 lb

## 2014-12-24 DIAGNOSIS — M25572 Pain in left ankle and joints of left foot: Secondary | ICD-10-CM

## 2014-12-24 DIAGNOSIS — G43909 Migraine, unspecified, not intractable, without status migrainosus: Secondary | ICD-10-CM

## 2014-12-24 DIAGNOSIS — J302 Other seasonal allergic rhinitis: Secondary | ICD-10-CM | POA: Diagnosis not present

## 2014-12-24 MED ORDER — FLUTICASONE PROPIONATE 50 MCG/ACT NA SUSP: NASAL | Status: DC

## 2014-12-24 MED ORDER — BACLOFEN 10 MG PO TABS: 10.0000 mg | ORAL_TABLET | Freq: Three times a day (TID) | ORAL | Status: DC

## 2014-12-24 NOTE — Patient Instructions (Signed)
Stop the Xarelto. Get back into the compression stockings. Rest. Elevate. Gradually advance your activity, but be careful to not do too much too fast.

## 2014-12-24 NOTE — Progress Notes (Signed)
Patient ID: Katherine Adams, female    DOB: Apr 09, 1961, 54 y.o.   MRN: [id number]  PCP: No PCP Per Patient  Subjective:   Chief Complaint  Patient presents with  . Follow-up    DOPPLER STUDY   . Foot Pain    RIGHT - per patient continues to bother her   . Medication Refill    baclofen and flonase    HPI Presents for evaluation of pain and swelling of the LEFT ankle and foot.  Symptoms began last week, following her busy time at work for [employer]. She spend more time on her feet than usual, but wore the compression stockings every day, due to her history of venous insufficiency and bilateral DVT. During the Market she felt well, didn't have more leg pain or swelling than usual, but after the last day, was so tired that she went to bed with the thigh high stocking on. When she awoke the next morning, she noted considerable pain and swelling just below and anterior to the lateral ankle bone on the LEFT.  She presented here in 4/21 for evaluation and was sent for LE doppler study the following day. Due to the delay in scanning, she was started on Xarelto. The study was NEGATIVE for DVT, but she notes that the scan did not include the part that hurt. When she inquired of the tech, the response was "I did what your doctor ordered." Though she was advised she could stop the Xarelto, she was concerned that the scan may not have shown a clot below the area that was doppled, and she continued the Xarelto at a reduced frequency (QD instead of BID).  Since then, she notes improvement, thought pain and swelling worsen with increased activity. Not currently wearing the compression stocking, as donning them causes pain. She's wearing flip-flops because her regular shoes press on the area of pain and increases pain.  She's tried no medications. Recalls no injury to the ankle/foot or leg. This pain is different from previous stress fracture in the RIGHT foot, and isn't as severe as pain from DVT  several years ago (she had bilateral LE DVT, but pain only in one leg). Pain intermittently shoots up from the ankle to the calf.  No CP, SOB, HA, dizziness.    Review of Systems  Constitutional: Negative.   Respiratory: Negative for cough, chest tightness, shortness of breath and wheezing.   Cardiovascular: Positive for leg swelling. Negative for chest pain and palpitations.  Musculoskeletal: Positive for myalgias and arthralgias.  Skin: Negative for color change, pallor, rash and wound.  Neurological: Negative for dizziness and weakness.       Patient Active Problem List   Diagnosis Date Noted  . Seasonal allergies   . History of DVT of lower extremity   . DJD (degenerative joint disease) of knee      Prior to Admission medications   Medication Sig Start Date End Date Taking? Authorizing Provider  ACETAMINOPHEN PO Take by mouth as needed.   Yes Historical Provider, MD  aspirin EC 81 MG tablet Take 81 mg by mouth daily.   Yes Historical Provider, MD  baclofen (LIORESAL) 10 MG tablet Take 1 tablet (10 mg total) by mouth 3 (three) times daily. 12/24/14  Yes Blease Capaldi S Donneisha Beane, PA-C  calcium carbonate (OS-CAL) 600 MG TABS Take 600 mg by mouth 2 (two) times daily with a meal.     Yes Historical Provider, MD  cetirizine (ZYRTEC) 10 MG tablet Take 10 mg  by mouth daily.     Yes Historical Provider, MD  Cholecalciferol (VITAMIN D) 2000 UNITS tablet Take 2,000 Units by mouth 2 (two) times daily.   Yes Historical Provider, MD  docusate sodium (COLACE) 100 MG capsule Take 100 mg by mouth 2 (two) times daily.   Yes Historical Provider, MD  FLUoxetine (PROZAC) 10 MG tablet 1 tablet a day, may add extra tablet prn as needed 01/07/14  Yes Roanna BanningPatricia R Grubb, FNP  fluticasone (FLONASE) 50 MCG/ACT nasal spray USE 2 SPRAYS IN EACH NOSTRIL DAILY AS NEEDED FOR ALLERGIES 12/24/14  Yes Navneet Schmuck S Rowan Blaker, PA-C  glucosamine-chondroitin 500-400 MG tablet Take 1 tablet by mouth 2 (two) times daily.   Yes  Historical Provider, MD  magnesium gluconate (MAGONATE) 500 MG tablet Take 500 mg by mouth daily.     Yes Historical Provider, MD  Pseudoephedrine-Guaifenesin [facility](MUCINEX D PO) Take by mouth daily.   Yes Historical Provider, MD  spironolactone (ALDACTONE) 50 MG tablet Take 25 mg by mouth daily. 25 mg   Yes Historical Provider, MD  KRILL OIL 1000 MG CAPS Take by mouth daily.    Historical Provider, MD  medroxyPROGESTERone (PROVERA) 10 MG tablet Take 1 tablet (10 mg total) by mouth daily. Patient not taking: Reported on 12/24/2014 04/26/13   Roanna BanningPatricia R Grubb, FNP  Multiple Vitamins-Minerals (MULTIVITAMIN PO) Take by mouth daily.      Historical Provider, MD     Allergies  Allergen Reactions  . Levofloxacin Other (See Comments)    Joint pain, fluid collection  . Tape Itching and Other (See Comments)    redness  . Tramadol Nausea And Vomiting  . Triptans     hypercoagulability  . Naproxen Sodium Rash       Objective:  Physical Exam  Constitutional: She is oriented to person, place, and time. She appears well-developed and well-nourished. She is active and cooperative. No distress.  BP 116/84 mmHg  Pulse 71  Temp(Src) 98 F (36.7 C) (Oral)  Resp 16  Ht 5' 7.5" (1.715 m)  Wt 217 lb 3.2 oz (98.521 kg)  BMI 33.50 kg/m2  SpO2 99%   Eyes: Conjunctivae are normal.  Pulmonary/Chest: Effort normal.  Musculoskeletal:       Right ankle: Normal. Achilles tendon normal.       Left ankle: She exhibits swelling (very mild). She exhibits normal range of motion, no ecchymosis, no deformity, no laceration and normal pulse. Tenderness. Achilles tendon normal.       Right foot: Normal.       Left foot: Normal.       Feet:  Neurological: She is alert and oriented to person, place, and time.  Psychiatric: She has a normal mood and affect. Her speech is normal and behavior is normal.   LEFT Ankle: [facility] reading (PRIMARY) by  Dr. Cleta Albertsaub. Normal ankle.          Assessment & Plan:   1. Ankle  pain, left Unlikely to be clot in ankle/foot. Stop Xarelto. Rest, elevate. Resume use of compression stockings. I'll contact [facility] to discuss the possibility of follow-up there (she has a large deductible for hospital-based visits/procedures). - DG Ankle Complete Left; Future  2. Migraine without status migrainosus, not intractable, unspecified migraine type Stable. Continue current medications. - baclofen (LIORESAL) 10 MG tablet; Take 1 tablet (10 mg total) by mouth 3 (three) times daily.  Dispense: 30 each; Refill: 3  3. Seasonal allergies Stable. Continue current medications. - fluticasone (FLONASE) 50 MCG/ACT nasal spray; USE  2 SPRAYS IN EACH NOSTRIL DAILY AS NEEDED FOR ALLERGIES  Dispense: 16 g; Refill: 10   Fernande Bras, PA-C Physician Assistant-Certified [facility]

## 2015-01-09 ENCOUNTER — Ambulatory Visit (INDEPENDENT_AMBULATORY_CARE_PROVIDER_SITE_OTHER): Payer: BLUE CROSS/BLUE SHIELD | Admitting: Nurse Practitioner

## 2015-01-09 ENCOUNTER — Encounter: Payer: Self-pay | Admitting: Family Medicine

## 2015-01-09 ENCOUNTER — Ambulatory Visit (INDEPENDENT_AMBULATORY_CARE_PROVIDER_SITE_OTHER): Payer: BLUE CROSS/BLUE SHIELD | Admitting: Family Medicine

## 2015-01-09 ENCOUNTER — Encounter: Payer: Self-pay | Admitting: Nurse Practitioner

## 2015-01-09 VITALS — BP 109/72 | HR 69 | Temp 99.2°F | Resp 16 | Ht 67.75 in | Wt 219.6 lb

## 2015-01-09 VITALS — BP 104/76 | HR 68 | Ht 67.75 in | Wt 219.0 lb

## 2015-01-09 DIAGNOSIS — I872 Venous insufficiency (chronic) (peripheral): Secondary | ICD-10-CM

## 2015-01-09 DIAGNOSIS — Z01419 Encounter for gynecological examination (general) (routine) without abnormal findings: Secondary | ICD-10-CM

## 2015-01-09 DIAGNOSIS — Z0000 Encounter for general adult medical examination without abnormal findings: Secondary | ICD-10-CM

## 2015-01-09 DIAGNOSIS — M79672 Pain in left foot: Secondary | ICD-10-CM

## 2015-01-09 LAB — POCT URINALYSIS DIPSTICK
Bilirubin, UA: NEGATIVE
Blood, UA: NEGATIVE
GLUCOSE UA: NEGATIVE
Ketones, UA: NEGATIVE
Nitrite, UA: NEGATIVE
Protein, UA: NEGATIVE
UROBILINOGEN UA: NEGATIVE
pH, UA: 6

## 2015-01-09 LAB — VITAMIN B12: VITAMIN B 12: 548 pg/mL (ref 211–911)

## 2015-01-09 LAB — HEMOGLOBIN, FINGERSTICK: Hemoglobin, fingerstick: 13.2 g/dL (ref 12.0–16.0)

## 2015-01-09 NOTE — Progress Notes (Addendum)
Patient ID: Katherine Adams, female   DOB: Apr 07, 1961, 54 y.o.   MRN: [id number] 54 y.o. [id number] Married  Caucasian Fe here for annual exam.  Did not do Provera challenge last year.  Some vaso symptoms that are better now than last year.  No vaginal spotting or bleeding.  More warm in the mornings.  No vaginal dryness. Several major issues with family health - in-laws are having to move to assisted living, grandmother with CVA moved to [place] and selling the house after renovations; estranged son's marriage, and economic hurdles.  Has felt overwhelmed but has maintained her strength through faith.  She does have some fatigue and would like to get Vitamin levels checked.  Patient's last menstrual period was 12/21/2013 (exact date).          Sexually active: Yes.    The current method of family planning is tubal ligation.    Exercising: No.  The patient does not participate in regular exercise at present. Smoker:  no  Health Maintenance: Pap:  10/12/11, negative with neg HR HPV MMG:  11/03/14, Bi-Rads 1:  Negative Colonoscopy:  02/2013, inconclusive, recommended repeat in one year, pt not scheduling at this time BMD:   10/21/10, Z Score, 0.3 S/0.4 L TDaP:  UTD Labs:  HB:  13.2  Urine:  2+ leuk's no UA symptoms   reports that she has never smoked. She has never used smokeless tobacco. She reports that she drinks about 0.6 oz of alcohol per week. She reports that she does not use illicit drugs.  Past Medical History  Diagnosis Date  . Allergic rhinitis, cause unspecified   . Onychomycosis 2012  . Headache(784.0)     migraines  . DVT (deep venous thrombosis) 02/25/2010    Bilateral LE, after stress fx of foot  . Acne vulgaris   . 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 Bursitis 2004    left hip  . Scoliosis 2004    mild  . Plantar fasciitis 2004    bilateral    Past Surgical History  Procedure Laterality Date  . Tubal ligation Bilateral 11/1994  . Tonsillectomy and  adenoidectomy    . Wisdom tooth extraction  age 54    and removal of 4 other teeth - braces X 2    Current Outpatient Prescriptions  Medication Sig Dispense Refill  . ACETAMINOPHEN PO Take by mouth as needed.    Marland Kitchen. aspirin EC 81 MG tablet Take 81 mg by mouth daily.    . baclofen (LIORESAL) 10 MG tablet Take 1 tablet (10 mg total) by mouth 3 (three) times daily. 30 each 3  . calcium carbonate (OS-CAL) 600 MG TABS Take 600 mg by mouth 2 (two) times daily with a meal.      . cetirizine (ZYRTEC) 10 MG tablet Take 10 mg by mouth daily.      . Cholecalciferol (VITAMIN D) 2000 UNITS tablet Take 2,000 Units by mouth 2 (two) times daily.    Marland Kitchen. docusate sodium (COLACE) 100 MG capsule Take 100 mg by mouth 2 (two) times daily.    Marland Kitchen. FLUoxetine (PROZAC) 10 MG tablet 1 tablet a day, may add extra tablet prn as needed 180 tablet 3  . fluticasone (FLONASE) 50 MCG/ACT nasal spray USE 2 SPRAYS IN EACH NOSTRIL DAILY AS NEEDED FOR ALLERGIES 16 g 10  . glucosamine-chondroitin 500-400 MG tablet Take 1 tablet by mouth 2 (two) times daily.    . magnesium gluconate (MAGONATE)  500 MG tablet Take 500 mg by mouth daily.      . Misc Natural Products (TART CHERRY ADVANCED PO) Take 1,200 mg by mouth daily. TART CHERRY EXTRACT    . Multiple Vitamins-Minerals (MULTIVITAMIN PO) Take by mouth daily.      . Pseudoephedrine-Guaifenesin (MUCINEX D PO) Take by mouth daily.    Marland Kitchen. spironolactone (ALDACTONE) 50 MG tablet Take 25 mg by mouth daily. 25 mg    . methylPREDNISolone (MEDROL) 4 MG TBPK tablet Tapering 6 day dose pack 21 tablet 0   No current facility-administered medications for this visit.    Family History  Problem Relation Age of Onset  . Diabetes Maternal Aunt   . Diabetes Mother   . Heart disease Mother   . Thyroid disease Mother   . Hyperlipidemia Mother   . Hyperlipidemia Father   . Diabetes Maternal Aunt   . Stroke Maternal Grandmother   . Stroke Maternal Grandfather   . COPD Paternal Grandfather      ROS:  Pertinent items are noted in HPI.  Otherwise, a comprehensive ROS was negative.  Exam:   BP 104/76 mmHg  Pulse 68  Ht 5' 7.75" (1.721 m)  Wt 219 lb (99.338 kg)  BMI 33.54 kg/m2  LMP 12/21/2013 (Exact Date) Height: 5' 7.75" (172.1 cm) Ht Readings from Last 3 Encounters:  01/09/15 5' 7.75" (1.721 m)  01/09/15 5' 7.75" (1.721 m)  12/24/14 5' 7.5" (1.715 m)    General appearance: alert, cooperative and appears stated age Head: Normocephalic, without obvious abnormality, atraumatic Neck: no adenopathy, supple, symmetrical, trachea midline and thyroid normal to inspection and palpation Lungs: clear to auscultation bilaterally Breasts: normal appearance, no masses or tenderness Heart: regular rate and rhythm Abdomen: soft, non-tender; no masses,  no organomegaly Extremities: extremities normal, atraumatic, no cyanosis or edema Skin: Skin color, texture, turgor normal. No rashes or lesions Lymph nodes: Cervical, supraclavicular, and axillary nodes normal. No abnormal inguinal nodes palpated Neurologic: Grossly normal   Pelvic: External genitalia:  no lesions              Urethra:  normal appearing urethra with no masses, tenderness or lesions              Bartholin's and Skene's: normal                 Vagina: normal appearing vagina with normal color and discharge, no lesions              Cervix: anteverted              Pap taken: Yes.   Bimanual Exam:  Uterus:  normal size, contour, position, consistency, mobility, non-tender              Adnexa: no mass, fullness, tenderness               Rectovaginal: Confirms               Anus:  normal sphincter tone, no lesions  Chaperone present:  yes  A:  Well Woman with normal exam  ? Perimenopausal vs. Menopausal (no Provera challenge last year)  Situational stressors - multiple  Recent abnormal colonoscopy and is scheduling for a repeat   P:   Reviewed health and wellness pertinent to exam  Pap smear taken  today  Mammogram due 10/2015  Will follow with labs  Colonoscopy is due and  will get scheduled  Counseled on breast self exam, mammography screening, adequate intake of calcium  and vitamin D, diet and exercise, Kegel's exercises return annually or prn  An After Visit Summary was printed and given to the patient.

## 2015-01-09 NOTE — Patient Instructions (Signed)

## 2015-01-09 NOTE — Patient Instructions (Signed)
ARNICA cream is a topical agent that helps reduce pain and swelling. You can apply this product several times a day. Try to stay off foot and elevate it to reduce swelling. Try TUMERIC capsules; this herbal supplement has anti-inflammatory properties.    I have ordered a referral to PODIATRY (high priority) so you should be contacted about an appointment within the next week. If you have increased pain and decide you want a prescription for pain medication (narcotic), contact the clinic and leave a message.

## 2015-01-09 NOTE — Progress Notes (Signed)
S:  This 54 y.o female is here for re-evaluation of persistent L ankle pain and swollen foot.  (See 12/19/14 & 12/24/2014 note for history of this problem).  Initially, she had foot pain immediately following conclusion of [employer]. Pain was located over medial malleolus w/ palpable nodule. Given hx of DVT, US lower ext venous was ordered (12/20/14) >>  Negative for DVT. Pt was started on Xarelto 15 mg bid then reduced med to once daily until 12/24/2014 when she was advised that she could stop the medication.   Pain and swelling have increased to point that pt had trouble sleeping; swelling now involves top of foot and digits feel tight. Pt cannot bear weight and cannot wear compression hose or usual tennis shoes or any closed shoe. Today, she is wearing flip flops. Pt denies acute injury or calf pain. Xray obtained on 12/24/14 was negative for fracture, dislocation or joint effusion; small plantar heel spur noted.  Pt able to work from home and elevate feet >> some relief. Taking acetaminophen 3 extra strength tabs with some temporary relief.   Patient Active Problem List   Diagnosis Date Noted  . Chronic venous insufficiency 01/09/2015  . Seasonal allergies   . History of DVT of lower extremity   . DJD (degenerative joint disease) of knee     Prior to Admission medications   Medication Sig Start Date End Date Taking? Authorizing Provider  ACETAMINOPHEN PO Take by mouth as needed.   Yes Historical Provider, MD  aspirin EC 81 MG tablet Take 81 mg by mouth daily.   Yes Historical Provider, MD  baclofen (LIORESAL) 10 MG tablet Take 1 tablet (10 mg total) by mouth 3 (three) times daily. 12/24/14  Yes Chelle Tinnie GensJeffrey, PA-C  calcium carbonate (OS-CAL) 600 MG TABS Take 600 mg by mouth 2 (two) times daily with a meal.     Yes Historical Provider, MD  cetirizine (ZYRTEC) 10 MG tablet Take 10 mg by mouth daily.     Yes Historical Provider, MD  Cholecalciferol (VITAMIN D) 2000 UNITS tablet Take 2,000  Units by mouth 2 (two) times daily.   Yes Historical Provider, MD  docusate sodium (COLACE) 100 MG capsule Take 100 mg by mouth 2 (two) times daily.   Yes Historical Provider, MD  FLUoxetine (PROZAC) 10 MG tablet 1 tablet a day, may add extra tablet prn as needed 01/07/14  Yes Ria CommentPatricia Grubb, FNP  fluticasone [facility](FLONASE) 50 MCG/ACT nasal spray USE 2 SPRAYS IN EACH NOSTRIL DAILY AS NEEDED FOR ALLERGIES 12/24/14  Yes Chelle Tinnie GensJeffrey, PA-C  glucosamine-chondroitin 500-400 MG tablet Take 1 tablet by mouth 2 (two) times daily.   Yes Historical Provider, MD  magnesium gluconate (MAGONATE) 500 MG tablet Take 500 mg by mouth daily.     Yes Historical Provider, MD  Misc Natural Products (TART CHERRY ADVANCED PO) Take 1,200 mg by mouth daily. TART CHERRY EXTRACT   Yes Historical Provider, MD  Multiple Vitamins-Minerals (MULTIVITAMIN PO) Take by mouth daily.     Yes Historical Provider, MD  Pseudoephedrine-Guaifenesin [facility](MUCINEX D PO) Take by mouth daily.   Yes Historical Provider, MD  spironolactone (ALDACTONE) 50 MG tablet Take 25 mg by mouth daily. 25 mg   Yes Historical Provider, MD    SURG, SOC and FAM HX reviewed.  ROS: As per HPI.  O: Filed Vitals:   01/09/15 1610  BP: 109/72  Pulse: 69  Temp: 99.2 F (37.3 C)  Resp: 16   GEN: In NAD; WN,WD. HENT: [place]/AT;  otherwise unremarkable COR: RRR. LUNGS: Unlabored resp. MS: MAEs; lower legs- no Achilles tendon or calf tenderness bilaterally.  R foot appears normal w/ full ROM and normal distal pulses. DP/TD pulses normal. L foot- soft tissue swelling including digits; tender over mid-foot/medial instep. Dorsiflexion and plantar flexion limited by swelling. NEURO: A&O x 3; CNs intact. Nonfocal. Gait- pt limps.  A/P: Chronic venous insufficiency- Continue symptomatic treatment w/ foot elevation and APAP for pain.   Left foot pain - Try Arnica cream for pain. Tumeric capsules may be effective joint pain. Plan: Ambulatory referral to Podiatry

## 2015-01-10 ENCOUNTER — Encounter: Payer: Self-pay | Admitting: Physician Assistant

## 2015-01-10 LAB — URINALYSIS, MICROSCOPIC ONLY
BACTERIA UA: NONE SEEN
Casts: NONE SEEN
Crystals: NONE SEEN

## 2015-01-10 LAB — VITAMIN D 25 HYDROXY (VIT D DEFICIENCY, FRACTURES): Vit D, 25-Hydroxy: 51 ng/mL (ref 30–100)

## 2015-01-10 LAB — FOLLICLE STIMULATING HORMONE: FSH: 63.7 m[IU]/mL

## 2015-01-10 LAB — URINE CULTURE

## 2015-01-13 LAB — IPS PAP TEST WITH HPV

## 2015-01-19 NOTE — Progress Notes (Signed)
Encounter reviewed by Dr. Brook Silva.  

## 2015-01-21 ENCOUNTER — Ambulatory Visit (INDEPENDENT_AMBULATORY_CARE_PROVIDER_SITE_OTHER): Payer: BLUE CROSS/BLUE SHIELD | Admitting: Podiatry

## 2015-01-21 ENCOUNTER — Encounter: Payer: Self-pay | Admitting: Podiatry

## 2015-01-21 ENCOUNTER — Ambulatory Visit (INDEPENDENT_AMBULATORY_CARE_PROVIDER_SITE_OTHER): Payer: BLUE CROSS/BLUE SHIELD

## 2015-01-21 VITALS — BP 115/73 | HR 74 | Resp 16

## 2015-01-21 DIAGNOSIS — R609 Edema, unspecified: Secondary | ICD-10-CM

## 2015-01-21 DIAGNOSIS — M79672 Pain in left foot: Secondary | ICD-10-CM

## 2015-01-21 DIAGNOSIS — M779 Enthesopathy, unspecified: Secondary | ICD-10-CM

## 2015-01-21 MED ORDER — METHYLPREDNISOLONE 4 MG PO TBPK: ORAL_TABLET | ORAL | Status: DC

## 2015-01-21 NOTE — Progress Notes (Signed)
   Subjective:    Patient ID: Katherine Adams, female    DOB: 08-31-1960, 54 y.o.   MRN: [id number]  HPI Comments: "I have pain on the top"  Patient c/o aching and swelling forefoot and midfoot left for about few months. She did [employer] for 3 weeks and noticed after completion it was very swollen. Shoes uncomfortable. No injury. Saw PCP and did doppler and xray-negative for clots and fractures. Taking hydrocodone, icing and elevating.   Foot Pain Associated symptoms include arthralgias.      Review of Systems  Constitutional: Positive for activity change.  HENT: Positive for sneezing and tinnitus.   Eyes: Positive for itching.  Cardiovascular: Positive for leg swelling.  Musculoskeletal: Positive for back pain, arthralgias and gait problem.  Allergic/Immunologic: Positive for environmental allergies.       Objective:   Physical Exam: I have reviewed her past medical history medications allergies surgeries and social history also reviewed her negative DVT report for her left calf. Pulses remain palpable bilateral. Nonpitting edema to the left lower extremity with mild erythematous dorsal aspect of the left foot. Neurologic sensorium is intact per Semmes-Weinstein monofilament bilateral. Deep tendon reflexes are intact bilaterally muscle strength +5 over 5 dorsiflexion plantar flexors and inverters everters all musculature is intact. Orthopedic evaluation of his result joints distal to the ankle level full range of motion without crepitation. Radiographic evaluation demonstrates no fractures mild pes planus and considerable edema across the dorsal aspect of the left foot. No calcifications are noted.        Assessment & Plan:  Assessment: This appears to be strictly a venous insufficiency issue with some type of inflammatory event to the left foot.  Plan: Prescription for Medrol Dosepak and a warm compress the dorsal aspect of the left foot. I'll follow up with her in 3-4 weeks.  We may consider sending her to vascular for venous insufficiency exam.

## 2015-01-28 ENCOUNTER — Telehealth: Payer: Self-pay | Admitting: *Deleted

## 2015-01-28 DIAGNOSIS — I872 Venous insufficiency (chronic) (peripheral): Secondary | ICD-10-CM

## 2015-01-28 NOTE — Telephone Encounter (Signed)
Patient needs to be sent for vascular testing - venous insuffiencey per Dr Al CorpusHyatt

## 2015-01-28 NOTE — Telephone Encounter (Addendum)
Pt states she has completed the Prednisone dose pack and some of the swelling and a little of the pain has returned.  What does she do next?  I informed pt of Dr. Geryl RankinsHyatt's orders.  Pt states [facility] she would have to pay the whole deductible, so she wanted to talk with her primary doctor and have him order at an office that would do it as an office visit.  I asked pt to let us know the outcome of her primary doctor's order or the results if ordered elsewhere by her doctor.  Pt states she will keep us informed.

## 2015-01-29 ENCOUNTER — Encounter: Payer: Self-pay | Admitting: Physician Assistant

## 2015-01-30 ENCOUNTER — Encounter: Payer: Self-pay | Admitting: Physician Assistant

## 2015-01-30 ENCOUNTER — Telehealth: Payer: Self-pay | Admitting: Physician Assistant

## 2015-01-30 DIAGNOSIS — I872 Venous insufficiency (chronic) (peripheral): Secondary | ICD-10-CM

## 2015-01-30 NOTE — Telephone Encounter (Signed)
Please call Dr. Geryl RankinsHyatt's office (podiatry). He wants this patient to have a LE doppler.  She needs me to schedule it for insurance purposes, so I need to know the specific test they want and the diagnosis they are using (venous insufficiency, based on the phone message from their office).  Thanks!

## 2015-02-04 ENCOUNTER — Telehealth: Payer: Self-pay | Admitting: *Deleted

## 2015-02-04 NOTE — Telephone Encounter (Signed)
Dr Geryl RankinsHyatt's RN called and stated pt needs a venous doppler LE bilateral d/t venous insufficiency. I ordered test after checking w/Donna to verify that EPIC pulled up correct test when I put in venous doppler. Chelle, FYI.

## 2015-02-04 NOTE — Telephone Encounter (Signed)
LMOM for RN at Dr Geryl RankinsHyatt's office, [phone], [facility] w/specific test and Dx.

## 2015-02-04 NOTE — Telephone Encounter (Signed)
Katherine MccreedyBarbara [facility] asked for the diagnosis and specific testing requested by Dr. Al CorpusHyatt, so they can order for pt.  I informed Katherine MccreedyBarbara the Venous dopplers should be B/L for venous insufficiency.

## 2015-02-04 NOTE — Telephone Encounter (Signed)
There is an issue with this patient's insurance.  She cannot have this test performed at a hospital based facility. It must be an outpatient, ambulatory facility and billed as such.

## 2015-02-06 ENCOUNTER — Encounter: Payer: Self-pay | Admitting: Physician Assistant

## 2015-02-11 ENCOUNTER — Other Ambulatory Visit: Payer: Self-pay | Admitting: Nurse Practitioner

## 2015-02-11 ENCOUNTER — Ambulatory Visit: Payer: BLUE CROSS/BLUE SHIELD | Admitting: Podiatry

## 2015-02-11 NOTE — Telephone Encounter (Signed)
Medication refill request: Prozac Last AEX:  01-09-15  Next AEX: 01-13-16 Last MMG (if hormonal medication request): 11-13-14  WNL Refill authorized: please advise

## 2015-02-13 NOTE — Telephone Encounter (Signed)
Jamesetta Geralds spoken with Dr. Bosie Helper office.  This patient has a high deductible plan. We need to have the scan scheduled as an outpatient and not as a hospital based facility (which activates her deductible).  Apparently, only Dr. Arbie Cookey is willing to consider this, as they usually bill as a hospital-based facility for their procedures, so her visit needs to be specifically with Dr. Arbie Cookey.  Let me know if you have any trouble.  Warmly, Cici Rodriges

## 2015-02-20 ENCOUNTER — Telehealth: Payer: Self-pay

## 2015-02-20 NOTE — Telephone Encounter (Signed)
I've left a message for Katherine Adams at [facility] about this patient. I believe that she's going to need an appointment with Dr. Arbie Cookey in order to get what we need.  I've asked that Katherine Adams call be back (and to ask for [name] if I am not available), and also given the patient the number for [facility] as she may be able to explain better.  I know that this is confusing, I'm so sorry. Let's talk face to face and see if we can sort it out.

## 2015-02-20 NOTE — Telephone Encounter (Signed)
Message     Hi Chelle, I still have not heard anything. Am I waiting for Dr. Bosie Helper office to call me?        Thanks,    Katherine Adams    ----- Message -----    From: Olene Floss    Sent: 02/13/2015 8:04 AM EDT    To: [name]    Subject: RE: Non-Urgent Medical Question        Katherine Adams,        I am so sorry. There are MULTIPLE messages in your record about this.        We DID get the information from Dr. Geryl Rankins office and I asked that this be scheduled at Dr. Bosie Helper office, explaining why (I had already spoken with his office, and he is the only one who is willing to consider this). I will call there this morning.        [name] in referral stated there was no referrals for this pt. Chelle can you update me on this. Thanks.

## 2015-02-20 NOTE — Telephone Encounter (Signed)
Schedule scan? I am sorry I am still confused about this whole situation.

## 2015-02-20 NOTE — Telephone Encounter (Signed)
I've spoken with Dr. Bosie Helper office. This patient has a high deductible plan. We need to have the scan scheduled as an outpatient and not as a hospital based facility (which activates her deductible). Apparently, only Dr. Arbie Cookey is willing to consider this, as they usually bill as a hospital-based facility for their procedures, so her visit needs to be specifically with Dr. Arbie Cookey.  Let me know if you have any trouble.

## 2015-02-25 NOTE — Telephone Encounter (Signed)
Chelle, I spoke with Juliette AlcideMelinda and here is the deal:  1. We can order the venous reflux through [facility] but there is a chance that they will not accept the report. She states the doctors have accepted them in the past but she cannot guarantee this to the pt. Meaning she would have to get it done again which causes the procedure to apply to her deductible.  2. She will have a follow up after the laser procedure which includes an ultrasound to check to see if the vein is ok. This will also go towards her deductible and a lot of patients don't realize that they have to have a follow up exam.  3. If you have any other questions, you can call her [phone].

## 2015-02-26 NOTE — Telephone Encounter (Signed)
Ok, please call the patient and tell her this. Ask her to call Juliette AlcideMelinda if she has specific questions.  Ask the patient how she'd like to proceed.

## 2015-02-27 NOTE — Telephone Encounter (Signed)
Sent mychart message to patient

## 2015-05-19 ENCOUNTER — Telehealth: Payer: Self-pay | Admitting: Nurse Practitioner

## 2015-05-19 NOTE — Telephone Encounter (Signed)
Katherine Adams, okay to update in her chart as Prozac 20 mg po daily and not just PRN?

## 2015-05-19 NOTE — Telephone Encounter (Signed)
Patient wanted to let nurse know that she is now taking 2 fluoxetine tablets per day.

## 2015-05-19 NOTE — Telephone Encounter (Signed)
Yes OK to update and she may have refills on 20 mg dosing daily until  AEX.

## 2015-05-20 NOTE — Telephone Encounter (Signed)
Left detailed message to advise that medication was updated and to return call if needs any refills. Patient has current prescription. Detailed message okay per designated party release form.   Routing to provider for final review. Patient agreeable to disposition. Will close encounter.

## 2015-06-05 ENCOUNTER — Telehealth: Payer: Self-pay | Admitting: Nurse Practitioner

## 2015-06-05 NOTE — Telephone Encounter (Signed)
Routing to [employer] CNM for review and advise as Ria Comment, FNP is out of the office today.

## 2015-06-05 NOTE — Telephone Encounter (Signed)
I would recommend Berniece Andreas who is a family counselor  [phone] Or Cleon Dew who is family faith based counselor [phone]

## 2015-06-05 NOTE — Telephone Encounter (Signed)
Patient would like a referral to a marriage or family counselor from [place].

## 2015-06-06 NOTE — Telephone Encounter (Signed)
Left detailed message okay per designated party release form to advise of message from Leota Sauers CNM.

## 2015-06-06 NOTE — Telephone Encounter (Signed)
Patient returned call and confirms names and numbers given.  She will call back with any concerns.  Will close encounter.

## 2015-06-07 ENCOUNTER — Encounter: Payer: Self-pay | Admitting: Physician Assistant

## 2015-08-12 ENCOUNTER — Ambulatory Visit (INDEPENDENT_AMBULATORY_CARE_PROVIDER_SITE_OTHER): Payer: BLUE CROSS/BLUE SHIELD

## 2015-08-12 DIAGNOSIS — Z23 Encounter for immunization: Secondary | ICD-10-CM

## 2015-11-11 ENCOUNTER — Other Ambulatory Visit: Payer: Self-pay

## 2015-11-11 DIAGNOSIS — Z1231 Encounter for screening mammogram for malignant neoplasm of breast: Secondary | ICD-10-CM

## 2016-01-13 ENCOUNTER — Ambulatory Visit (INDEPENDENT_AMBULATORY_CARE_PROVIDER_SITE_OTHER): Payer: BLUE CROSS/BLUE SHIELD | Admitting: Nurse Practitioner

## 2016-01-13 ENCOUNTER — Ambulatory Visit
Admission: RE | Admit: 2016-01-13 | Discharge: 2016-01-13 | Disposition: A | Discharge status: Home / Self Care | Payer: BLUE CROSS/BLUE SHIELD | Source: Ambulatory Visit

## 2016-01-13 ENCOUNTER — Encounter: Payer: Self-pay | Admitting: Nurse Practitioner

## 2016-01-13 VITALS — BP 112/66 | HR 64 | Ht 67.5 in | Wt 204.0 lb

## 2016-01-13 DIAGNOSIS — Z01419 Encounter for gynecological examination (general) (routine) without abnormal findings: Secondary | ICD-10-CM

## 2016-01-13 DIAGNOSIS — Z0000 Encounter for general adult medical examination without abnormal findings: Secondary | ICD-10-CM | POA: Diagnosis not present

## 2016-01-13 DIAGNOSIS — F430 Acute stress reaction: Secondary | ICD-10-CM | POA: Diagnosis not present

## 2016-01-13 DIAGNOSIS — Z1231 Encounter for screening mammogram for malignant neoplasm of breast: Secondary | ICD-10-CM

## 2016-01-13 LAB — COMPREHENSIVE METABOLIC PANEL
ALT: 14 U/L (ref 6–29)
AST: 18 U/L (ref 10–35)
Albumin: 4.6 g/dL (ref 3.6–5.1)
Alkaline Phosphatase: 78 U/L (ref 33–130)
BUN: 20 mg/dL (ref 7–25)
CALCIUM: 9.8 mg/dL (ref 8.6–10.4)
CHLORIDE: 103 mmol/L (ref 98–110)
CO2: 26 mmol/L (ref 20–31)
Creat: 0.87 mg/dL (ref 0.50–1.05)
Glucose, Bld: 93 mg/dL (ref 65–99)
Potassium: 4.7 mmol/L (ref 3.5–5.3)
Sodium: 139 mmol/L (ref 135–146)
Total Bilirubin: 0.4 mg/dL (ref 0.2–1.2)
Total Protein: 7.1 g/dL (ref 6.1–8.1)

## 2016-01-13 LAB — CBC
HEMATOCRIT: 41.3 % (ref 35.0–45.0)
HEMOGLOBIN: 13.6 g/dL (ref 11.7–15.5)
MCH: 30.3 pg (ref 27.0–33.0)
MCHC: 32.9 g/dL (ref 32.0–36.0)
MCV: 92 fL (ref 80.0–100.0)
MPV: 9.5 fL (ref 7.5–12.5)
Platelets: 260 10*3/uL (ref 140–400)
RBC: 4.49 MIL/uL (ref 3.80–5.10)
RDW: 14.4 % (ref 11.0–15.0)
WBC: 6.1 10*3/uL (ref 3.8–10.8)

## 2016-01-13 LAB — POCT URINALYSIS DIPSTICK
Bilirubin, UA: NEGATIVE
Blood, UA: NEGATIVE
Glucose, UA: NEGATIVE
KETONES UA: NEGATIVE
Leukocytes, UA: NEGATIVE
NITRITE UA: NEGATIVE
PH UA: 6
Protein, UA: NEGATIVE
UROBILINOGEN UA: NEGATIVE

## 2016-01-13 LAB — LIPID PANEL
Cholesterol: 208 mg/dL — ABNORMAL HIGH (ref 125–200)
HDL: 69 mg/dL (ref >=46)
LDL CALC: 125 mg/dL (ref <130)
TRIGLYCERIDES: 72 mg/dL (ref <150)
Total CHOL/HDL Ratio: 3 Ratio (ref <=5.0)
VLDL: 14 mg/dL (ref <30)

## 2016-01-13 MED ORDER — FLUOXETINE HCL 20 MG PO TABS: 20.0000 mg | ORAL_TABLET | Freq: Every day | ORAL | Status: DC

## 2016-01-13 NOTE — Progress Notes (Signed)
Patient ID: Katherine Adams, female   DOB: [birth year], 55 y.o.   MRN: [id number]  55 y.o. 62P2002 Married  Caucasian Fe here for annual exam.  No vaso symptoms.  Not currently SA.  Amenorrhea since 12/21/13.  Did not do a Provera challenge.  Last FSH 63.7 last year.  She and husband are still going through a lot of financial issues.  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  She feels so blessed to be in a good place emotionally for now.  Aging parents are doing well.  Patient's last menstrual period was 12/21/2013 (exact date).          Sexually active: Yes.    The current method of family planning is post menopausal status.    Exercising: No.  The patient does not participate in regular exercise at present. Smoker:  no  Health Maintenance: Pap: 01/09/15, Negative with negative HR HPV MMG: 11/13/14, Bi-Rads 1: Negative; scheduled for 01/13/16 Colonoscopy: 2014, Dr. Loreta AveMann  Needed a repeat in 1 yr. BMD: 10/21/10 Z Score, 0.3 Spine / 0.4 Left Femur Neck TDaP: 06/03/14 Hep C and HIV: done today Labs: to be drawn  Urine: negative   reports that she has never smoked. She has never used smokeless tobacco. She reports that she drinks about 0.6 oz of alcohol per week. She reports that she does not use illicit drugs.  Past Medical History  Diagnosis Date  . Allergic rhinitis, cause unspecified   . Onychomycosis 2012  . Headache(784.0)     migraines  . DVT (deep venous thrombosis) (HCC) 02/25/2010    Bilateral LE, after stress fx of foot  . Acne vulgaris   . 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 Bursitis 2004    left hip  . Scoliosis 2004    mild  . Plantar fasciitis 2004    bilateral    Past Surgical History  Procedure Laterality Date  . Tubal ligation Bilateral 11/1994  . Tonsillectomy and adenoidectomy    . Wisdom tooth extraction  age 55    and removal of 4 other teeth - braces X 2    Current Outpatient Prescriptions  Medication Sig  Dispense Refill  . ACETAMINOPHEN PO Take by mouth as needed.    Marland Kitchen. aspirin EC 81 MG tablet Take 81 mg by mouth daily.    . baclofen (LIORESAL) 10 MG tablet Take 1 tablet (10 mg total) by mouth 3 (three) times daily. 30 each 3  . calcium carbonate (OS-CAL) 600 MG TABS Take 600 mg by mouth 2 (two) times daily with a meal.      . cetirizine (ZYRTEC) 10 MG tablet Take 10 mg by mouth daily.      . Cholecalciferol (VITAMIN D) 2000 UNITS tablet Take 2,000 Units by mouth 2 (two) times daily.    Marland Kitchen. docusate sodium (COLACE) 100 MG capsule Take 100 mg by mouth 2 (two) times daily.    Marland Kitchen. FLUoxetine (PROZAC) 20 MG tablet Take 1 tablet (20 mg total) by mouth daily. 90 tablet 4  . fluticasone (FLONASE) 50 MCG/ACT nasal spray USE 2 SPRAYS IN EACH NOSTRIL DAILY AS NEEDED FOR ALLERGIES 16 g 10  . glucosamine-chondroitin 500-400 MG tablet Take 1 tablet by mouth 2 (two) times daily.    . magnesium gluconate (MAGONATE) 500 MG tablet Take 500 mg by mouth daily.      . Multiple Vitamins-Minerals (MULTIVITAMIN PO) Take by mouth  daily.      . spironolactone (ALDACTONE) 50 MG tablet Take 25 mg by mouth daily. 25 mg     No current facility-administered medications for this visit.    Family History  Problem Relation Age of Onset  . Diabetes Maternal Aunt   . Diabetes Mother   . Heart disease Mother   . Thyroid disease Mother   . Hyperlipidemia Mother   . Hyperlipidemia Father   . Diabetes Maternal Aunt   . Stroke Maternal Grandmother   . Stroke Maternal Grandfather   . COPD Paternal Grandfather     ROS:  Pertinent items are noted in HPI.  Otherwise, a comprehensive ROS was negative.  Exam:   BP 112/66 mmHg  Pulse 64  Ht 5' 7.5" (1.715 m)  Wt 204 lb (92.534 kg)  BMI 31.46 kg/m2  LMP 12/21/2013 (Exact Date) Height: 5' 7.5" (171.5 cm) Ht Readings from Last 3 Encounters:  01/13/16 5' 7.5" (1.715 m)  01/09/15 5' 7.75" (1.721 m)  01/09/15 5' 7.75" (1.721 m)    General appearance: alert, cooperative and  appears stated age Head: Normocephalic, without obvious abnormality, atraumatic Neck: no adenopathy, supple, symmetrical, trachea midline and thyroid normal to inspection and palpation Lungs: clear to auscultation bilaterally Breasts: normal appearance, no masses or tenderness Heart: regular rate and rhythm Abdomen: soft, non-tender; no masses,  no organomegaly Extremities: extremities normal, atraumatic, no cyanosis or edema Skin: Skin color, texture, turgor normal. No rashes or lesions Lymph nodes: Cervical, supraclavicular, and axillary nodes normal. No abnormal inguinal nodes palpated Neurologic: Grossly normal   Pelvic: External genitalia:  no lesions              Urethra:  normal appearing urethra with no masses, tenderness or lesions              Bartholin's and Skene's: normal                 Vagina: normal appearing vagina with normal color and discharge, no lesions              Cervix: anteverted              Pap taken: No. Bimanual Exam:  Uterus:  normal size, contour, position, consistency, mobility, non-tender              Adnexa: no mass, fullness, tenderness               Rectovaginal: Confirms               Anus:  normal sphincter tone, no lesions  Chaperone present: yes  A:  Well Woman with normal exam  Menopausal [facility] 5/16 = 63.7) Situational stressors - multiple with marital discord Recent abnormal colonoscopy and is scheduling for a repeat this year with new insurance available to her   P:   Reviewed health and wellness pertinent to exam  Pap smear as above  Mammogram will be done today  Follow with labs  Refill on Prozac 20 mg daily for a year, encouraged her to remain in counseling  Counseled on breast self exam, mammography screening, adequate intake of calcium and vitamin D, diet and exercise return annually or prn  An After Visit Summary was printed and given to the patient.

## 2016-01-13 NOTE — Patient Instructions (Signed)

## 2016-01-14 LAB — TSH: TSH: 0.75 mIU/L

## 2016-01-14 LAB — HEPATITIS C ANTIBODY: HCV Ab: NEGATIVE

## 2016-01-14 LAB — VITAMIN D 25 HYDROXY (VIT D DEFICIENCY, FRACTURES): Vit D, 25-Hydroxy: 53 ng/mL (ref 30–100)

## 2016-01-14 LAB — HEMOGLOBIN, FINGERSTICK

## 2016-01-14 LAB — HIV ANTIBODY (ROUTINE TESTING W REFLEX): HIV 1&2 Ab, 4th Generation: NONREACTIVE

## 2016-01-15 ENCOUNTER — Telehealth: Payer: Self-pay

## 2016-01-15 NOTE — Telephone Encounter (Signed)
Called patient regarding lab results. Patient has not yet viewed them in MyChart. No answer, left message to call back.

## 2016-01-18 NOTE — Progress Notes (Signed)
Encounter reviewed by Dr. Ryu Cerreta Amundson C. Silva.  

## 2016-01-22 NOTE — Telephone Encounter (Signed)
Patient is requesting a dose change on her Prozac.

## 2016-01-22 NOTE — Telephone Encounter (Signed)
Please advise, medications were reviewed at office visit 01/13/16.

## 2016-01-22 NOTE — Telephone Encounter (Signed)
Patient is calling to see if her prescription can be changed. She states she normally take 10 mg but was given 20 mg for Fluoxetine. [facility]

## 2016-01-23 NOTE — Telephone Encounter (Addendum)
Patient is returning a call regarding her prescription change.  Patient said you do not need to return her call her unless you have questions.

## 2016-01-23 NOTE — Telephone Encounter (Signed)
Per patty, okay to close encounter. rx was already filled for patient for 20mg  90 day supply

## 2016-01-23 NOTE — Telephone Encounter (Signed)
Yes she may have an increase in Prozac from 10 mg to 20 mg.  Just saw her a few days ago and we discussed with her increase in family stressors that she may need an increase.  She may have # 90 then CB and review how she is doing and if need to continue at that dose.

## 2016-01-23 NOTE — Telephone Encounter (Signed)
Left message to call back  

## 2016-03-22 ENCOUNTER — Telehealth: Payer: Self-pay | Admitting: Nurse Practitioner

## 2016-03-22 NOTE — Telephone Encounter (Signed)
Return call to patient at work, she is at lunch. No answer on mobile. Message left to return call to [place] at [phone].    Menopausal, not on HRT.  Amenorrhea since 12/21/13.  Did not do a Provera challenge.  FSH 63.7 in 2016.

## 2016-03-22 NOTE — Telephone Encounter (Signed)
Patient is returning a call to Tracy. °

## 2016-03-22 NOTE — Telephone Encounter (Signed)
Call to patient. States at recent annual exam, she was told to call for any vaginal bleeding. Started with light spotting and abdominal aching this morning. No other complaints. Patient confirms last menses April 2015. Advised recommend office visit for evaluation of PMB and possibly endometrial biopsy discussed. Appointment scheduled for tomorrow 03-23-16 at 0815 with Dr Oscar La. Patient instructed to take Motrin 800 mg one hour prior with food. Patient states she cannot take Motrin due to history of blood clots but will take Tylenol.

## 2016-03-22 NOTE — Telephone Encounter (Signed)
Patient is spotting and has not had a cycle in a few years.

## 2016-03-23 ENCOUNTER — Ambulatory Visit (INDEPENDENT_AMBULATORY_CARE_PROVIDER_SITE_OTHER): Payer: BLUE CROSS/BLUE SHIELD | Admitting: Obstetrics and Gynecology

## 2016-03-23 ENCOUNTER — Encounter: Payer: Self-pay | Admitting: Obstetrics and Gynecology

## 2016-03-23 VITALS — BP 112/62 | HR 68 | Resp 16 | Ht 67.5 in | Wt 210.0 lb

## 2016-03-23 DIAGNOSIS — N950 Postmenopausal bleeding: Secondary | ICD-10-CM | POA: Diagnosis not present

## 2016-03-23 NOTE — Patient Instructions (Signed)

## 2016-03-23 NOTE — Progress Notes (Signed)
GYNECOLOGY  VISIT   HPI: 55 y.o.   Married  Caucasian  female   G2P2002 with Patient's last menstrual period was 12/21/2013 (exact date).   here for   Post-menopausal bleeding. Patient states that she has been spotting since yesterday; Not heavy. No discomfort today, yesterday she felt slight lower abdominal ache, L>R, 2/10 in severity. No recent intercourse, no straining or strenous activity prior to the spotting.   GYNECOLOGIC HISTORY: Patient's last menstrual period was 12/21/2013 (exact date). Contraception:Postmenopausal  Menopausal hormone therapy: none        OB History    Gravida Para Term Preterm AB Living   0 0 2   SAB TAB Ectopic Multiple Live Births   0 0 0 0           Patient Active Problem List   Diagnosis Date Noted  . Chronic venous insufficiency 01/09/2015  . Seasonal allergies   . History of DVT of lower extremity   . DJD (degenerative joint disease) of knee     Past Medical History:  Diagnosis Date  . Acne vulgaris   . Allergic rhinitis, cause unspecified   . Bursitis 2004   left hip  . DJD (degenerative joint disease) of knee    Right knee  . DVT (deep venous thrombosis) (HCC) 02/25/2010   Bilateral LE, after stress fx of foot  . Headache(784.0)    migraines  . Onychomycosis 2012  . Plantar fasciitis 2004   bilateral  . Scoliosis 2004   mild  DVT while on low dose OCP's  Past Surgical History:  Procedure Laterality Date  . TONSILLECTOMY AND ADENOIDECTOMY    . TUBAL LIGATION Bilateral 11/1994  . WISDOM TOOTH EXTRACTION  age 55   and removal of 4 other teeth - braces X 2    Current Outpatient Prescriptions  Medication Sig Dispense Refill  . ACETAMINOPHEN PO Take by mouth as needed.    Marland Kitchen aspirin EC 81 MG tablet Take 81 mg by mouth daily.    . B Complex-C (SUPER B COMPLEX PO) Take by mouth daily.    . baclofen (LIORESAL) 10 MG tablet Take 1 tablet (10 mg total) by mouth 3 (three) times daily. 30 each 3  . calcium carbonate (OS-CAL) 600  MG TABS Take 600 mg by mouth 2 (two) times daily with a meal.      . cetirizine (ZYRTEC) 10 MG tablet Take 10 mg by mouth daily.      . Cholecalciferol (VITAMIN D) 2000 UNITS tablet Take 2,000 Units by mouth 2 (two) times daily.    Marland Kitchen docusate sodium (COLACE) 100 MG capsule Take 100 mg by mouth 2 (two) times daily.    Marland Kitchen FLUoxetine (PROZAC) 20 MG tablet Take 1 tablet (20 mg total) by mouth daily. 90 tablet 4  . fluticasone (FLONASE) 50 MCG/ACT nasal spray USE 2 SPRAYS IN EACH NOSTRIL DAILY AS NEEDED FOR ALLERGIES 16 g 10  . glucosamine-chondroitin 500-400 MG tablet Take 1 tablet by mouth 2 (two) times daily.    . magnesium gluconate (MAGONATE) 500 MG tablet Take 500 mg by mouth daily.      . Misc Natural Products (TART CHERRY ADVANCED PO) Take 1,200 mg by mouth daily.    . Multiple Vitamins-Minerals (MULTIVITAMIN PO) Take by mouth daily.      Marland Kitchen spironolactone (ALDACTONE) 50 MG tablet Take 25 mg by mouth daily. 25 mg     No current facility-administered medications for this visit.  ALLERGIES: Levofloxacin; Tape; Tramadol; Triptans; and Naproxen sodium  Family History  Problem Relation Age of Onset  . Diabetes Maternal Aunt   . Diabetes Mother   . Heart disease Mother   . Thyroid disease Mother   . Hyperlipidemia Mother   . Hyperlipidemia Father   . Diabetes Maternal Aunt   . Stroke Maternal Grandmother   . Stroke Maternal Grandfather   . COPD Paternal Grandfather   1st cousin with breast cancer around 34  Social History   Social History  . Marital status: Married    Spouse name: Casimiro Needle  . Number of children: 2  . Years of education: 13   Occupational History  . Showroom Glass blower/designer  . Sales Rep     [employer]   Social History Main Topics  . Smoking status: Never Smoker  . Smokeless tobacco: Never Used  . Alcohol use 0.6 oz/week    1 Standard drinks or equivalent per week  . Drug use: No  . Sexual activity: Yes    Birth control/ protection:  Post-menopausal   Other Topics Concern  . Not on file   Social History Narrative   Lives with her husband.   Their daughter, Maralyn Sago, lives in [place] where she is a Engineer, civil (consulting).   Their son, Viviann Spare, lives in [place] with his wife.  They do not speak often.    Review of Systems  Constitutional: Negative.   HENT: Negative.   Eyes: Negative.   Respiratory: Negative.   Cardiovascular: Negative.   Gastrointestinal:       Bloating  Genitourinary:       Unscheduled bleeding or spotting Vulvar/vaginal lumps  Musculoskeletal: Positive for joint pain.       Hip pain  Skin: Negative.   Neurological: Negative.   Endo/Heme/Allergies: Negative.   Psychiatric/Behavioral: Negative.     PHYSICAL EXAMINATION:    BP 112/62 (BP Location: Right Arm, Patient Position: Sitting, Cuff Size: Normal)   Pulse 68   Resp 16   Ht 5' 7.5" (1.715 m)   Wt 210 lb (95.3 kg)   LMP 12/21/2013 (Exact Date)   BMI 32.41 kg/m     General appearance: alert, cooperative and appears stated age Neck: no adenopathy, supple, symmetrical, trachea midline and thyroid normal to inspection and palpation Abdomen: soft, non-tender;non distended; no masses,  no organomegaly CVA: not tender  Pelvic: External genitalia:  no lesions              Urethra:  normal appearing urethra with no masses, tenderness or lesions              Bartholins and Skenes: normal                 Vagina: normal appearing vagina with normal color and discharge, no lesions              Cervix: no lesions              Bimanual Exam:  Uterus:  normal size, contour, position, consistency, mobility, non-tender and anteverted              Adnexa: no mass, fullness, tenderness              Rectovaginal: Yes.  .  Confirms.              Anus:  normal sphincter tone, no lesions  Chaperone was present for exam.  The risks of endometrial biopsy were reviewed and a consent  was obtained.  A speculum was placed in the vagina and the cervix was cleansed with  betadine. A tenaculum was placed on the cervix and the mini-pipelle was placed into the endometrial cavity (definately in the cavity). The uterus sounded to 7 cm. The endometrial biopsy was performed, minimal tissue was obtained. The tenaculum and speculum were removed. There were no complications.    ASSESSMENT Postmenopausal bleeding    PLAN Normal exam Pap Endometrial biopsy done F/U for an ultrasound, possible sonohysterogram   An After Visit Summary was printed and given to the patient.  CC: Shirlyn Goltz, NP

## 2016-03-24 ENCOUNTER — Telehealth: Payer: Self-pay | Admitting: Obstetrics and Gynecology

## 2016-03-24 NOTE — Telephone Encounter (Signed)
Called patient to review benefits for a recommended procedure. Left Voicemail requesting a call back. °

## 2016-03-25 LAB — IPS PAP TEST WITH REFLEX TO HPV

## 2016-03-26 NOTE — Telephone Encounter (Signed)
Spoke with patient regarding benefit for sonohysterogram. Patient understood and agreeable. Patient ready to schedule. Patient scheduled 04/06/16 with Dr Oscar La. Patient  aware of arrival date and time.and cancellation policy. No further questions. Ok to close

## 2016-03-29 ENCOUNTER — Telehealth: Payer: Self-pay

## 2016-03-29 NOTE — Telephone Encounter (Signed)
Left message to call [name] at [phone]. 

## 2016-03-29 NOTE — Telephone Encounter (Signed)
-----   Message from Jill Evelyn Jertson, MD sent at 03/26/2016 12:19 PM EDT ----- Please inform the patient that her biopsy was normal and set her up for a TV ultrasound, possible sonohysterogram ( I thought I ordered it, but can't find it) 

## 2016-03-29 NOTE — Telephone Encounter (Signed)
Patient given results and verbalized understanding.  Will follow up at ultrasound appointment 04/06/16.

## 2016-04-06 ENCOUNTER — Ambulatory Visit (INDEPENDENT_AMBULATORY_CARE_PROVIDER_SITE_OTHER): Payer: BLUE CROSS/BLUE SHIELD

## 2016-04-06 ENCOUNTER — Other Ambulatory Visit: Payer: Self-pay | Admitting: Obstetrics and Gynecology

## 2016-04-06 ENCOUNTER — Ambulatory Visit (INDEPENDENT_AMBULATORY_CARE_PROVIDER_SITE_OTHER): Payer: BLUE CROSS/BLUE SHIELD | Admitting: Obstetrics and Gynecology

## 2016-04-06 VITALS — BP 124/88 | HR 62 | Resp 14 | Ht 67.5 in | Wt 209.0 lb

## 2016-04-06 DIAGNOSIS — N950 Postmenopausal bleeding: Secondary | ICD-10-CM

## 2016-04-06 NOTE — Progress Notes (Signed)
GYNECOLOGY  VISIT   HPI: 55 y.o.   Married  Caucasian  female   G2P2002 with Patient's last menstrual period was 12/21/2013 (exact date).   here for Ultrasound for PMP spotting. She had a normal pap and atrophic endometrial biopsy a few weeks ago.   GYNECOLOGIC HISTORY: Patient's last menstrual period was 12/21/2013 (exact date). Contraception: Post menopausal  Menopausal hormone therapy: None        OB History    Gravida Para Term Preterm AB Living   0 0 2   SAB TAB Ectopic Multiple Live Births   0 0 0 0 2         Patient Active Problem List   Diagnosis Date Noted  . Chronic venous insufficiency 01/09/2015  . Seasonal allergies   . History of DVT of lower extremity   . DJD (degenerative joint disease) of knee     Past Medical History:  Diagnosis Date  . Acne vulgaris   . Allergic rhinitis, cause unspecified   . Bursitis 2004   left hip  . DJD (degenerative joint disease) of knee    Right knee  . DVT (deep venous thrombosis) (HCC) 02/25/2010   Bilateral LE, after stress fx of foot  . Headache(784.0)    migraines  . Onychomycosis 2012  . Plantar fasciitis 2004   bilateral  . Scoliosis 2004   mild    Past Surgical History:  Procedure Laterality Date  . TONSILLECTOMY AND ADENOIDECTOMY    . TUBAL LIGATION Bilateral 11/1994  . WISDOM TOOTH EXTRACTION  age 10   and removal of 4 other teeth - braces X 2    Current Outpatient Prescriptions  Medication Sig Dispense Refill  . ACETAMINOPHEN PO Take by mouth as needed.    Marland Kitchen aspirin EC 81 MG tablet Take 81 mg by mouth daily.    . B Complex-C (SUPER B COMPLEX PO) Take by mouth daily.    . baclofen (LIORESAL) 10 MG tablet Take 1 tablet (10 mg total) by mouth 3 (three) times daily. 30 each 3  . calcium carbonate (OS-CAL) 600 MG TABS Take 600 mg by mouth 2 (two) times daily with a meal.      . cetirizine (ZYRTEC) 10 MG tablet Take 10 mg by mouth daily.      . Cholecalciferol (VITAMIN D) 2000 UNITS tablet  Take 2,000 Units by mouth 2 (two) times daily.    Marland Kitchen docusate sodium (COLACE) 100 MG capsule Take 100 mg by mouth 2 (two) times daily.    Marland Kitchen FLUoxetine (PROZAC) 20 MG tablet Take 1 tablet (20 mg total) by mouth daily. 90 tablet 4  . fluticasone (FLONASE) 50 MCG/ACT nasal spray USE 2 SPRAYS IN EACH NOSTRIL DAILY AS NEEDED FOR ALLERGIES 16 g 10  . glucosamine-chondroitin 500-400 MG tablet Take 1 tablet by mouth 2 (two) times daily.    . magnesium gluconate (MAGONATE) 500 MG tablet Take 500 mg by mouth daily.      . Misc Natural Products (TART CHERRY ADVANCED PO) Take 1,200 mg by mouth daily.    . Multiple Vitamins-Minerals (MULTIVITAMIN PO) Take by mouth daily.      Marland Kitchen spironolactone (ALDACTONE) 50 MG tablet Take 25 mg by mouth daily. 25 mg     No current facility-administered medications for this visit.      ALLERGIES: Levofloxacin; Tape; Tramadol; Triptans; and Naproxen sodium  Family History  Problem Relation Age of Onset  . Diabetes Mother   .  Heart disease Mother   . Thyroid disease Mother   . Hyperlipidemia Mother   . Hyperlipidemia Father   . Diabetes Maternal Aunt   . Stroke Maternal Grandmother   . Stroke Maternal Grandfather   . COPD Paternal Grandfather   . Diabetes Maternal Aunt     Social History   Social History  . Marital status: Married    Spouse name: Casimiro NeedleMichael  . Number of children: 2  . Years of education: 13   Occupational History  . 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   Social History Main Topics  . Smoking status: Never Smoker  . Smokeless tobacco: Never Used  . Alcohol use 0.6 oz/week    1 Standard drinks or equivalent per week  . Drug use: No  . Sexual activity: Yes    Birth control/ protection: Post-menopausal   Other Topics Concern  . Not on file   Social History Narrative   Lives with her husband.   Their daughter, Maralyn SagoSarah, lives in [place] where she is a Engineer, civil (consulting)nurse.   Their son, Viviann SpareSteven, lives in [place] with his wife.   They do not speak often.    Review of Systems  Constitutional: Negative.   HENT: Negative.   Eyes: Negative.   Respiratory: Negative.   Cardiovascular: Negative.   Gastrointestinal: Negative.   Genitourinary: Negative.   Musculoskeletal: Negative.   Skin: Negative.   Neurological: Negative.   Endo/Heme/Allergies: Negative.   Psychiatric/Behavioral: Negative.     PHYSICAL EXAMINATION:    BP 124/88 (BP Location: Left Arm, Patient Position: Sitting, Cuff Size: Large)   Pulse 62   Resp 14   Ht 5' 7.5" (1.715 m)   Wt 209 lb (94.8 kg)   LMP 12/21/2013 (Exact Date)   BMI 32.25 kg/m     General appearance: alert, cooperative and appears stated age  Pelvic: External genitalia:  no lesions              Urethra:  normal appearing urethra with no masses, tenderness or lesions              Bartholins and Skenes: normal                 Vagina: normal appearing vagina with normal color and discharge, no lesions              Cervix: no lesions, slightly friable today  Ultrasound reviewed, endometrial stripe 4.3-4.4 mm  Sonohysterogram The procedure and risks of the procedure were reviewed with the patient, consent form was signed. A speculum was placed in the vagina and the cervix was cleansed with betadine. The sonohysterogram catheter was inserted into the uterine cavity without difficulty. Saline was infused under direct observation with the ultrasound. No intracavitary defects were noted.The catheter was removed.    Chaperone was present for exam.  ASSESSMENT Postmenopausal bleeding. Normal pap, atrophy on endometrial biopsy negative sonohysterogram    PLAN Negative evaluation She will call with further bleeding   An After Visit Summary was printed and given to the patient.  10 minutes face to face time of which over 50% was spent in counseling.

## 2016-04-12 ENCOUNTER — Telehealth: Payer: Self-pay | Admitting: Nurse Practitioner

## 2016-04-12 ENCOUNTER — Other Ambulatory Visit: Payer: Self-pay | Admitting: Nurse Practitioner

## 2016-04-12 MED ORDER — FLUOXETINE HCL 10 MG PO TABS: ORAL_TABLET | ORAL | 4 refills | Status: DC

## 2016-04-12 NOTE — Telephone Encounter (Signed)
I will change RX to the correct dosing.

## 2016-04-13 NOTE — Telephone Encounter (Deleted)
-----   Message from Romualdo BolkJill Evelyn Jertson, MD sent at 03/26/2016 12:19 PM EDT ----- Please inform the patient that her biopsy was normal and set her up for a TV ultrasound, possible sonohysterogram ( I thought I ordered it, but can't find it)

## 2016-04-13 NOTE — Telephone Encounter (Signed)
Call to patient. Per ROI, can leave message on [phone]. Left message that RX was corrected and should be at pharmacy. Call back if any questions.   Encounter closed.

## 2016-05-08 ENCOUNTER — Ambulatory Visit (INDEPENDENT_AMBULATORY_CARE_PROVIDER_SITE_OTHER): Payer: BLUE CROSS/BLUE SHIELD

## 2016-05-08 DIAGNOSIS — Z23 Encounter for immunization: Secondary | ICD-10-CM

## 2016-10-09 IMAGING — US US EXTREM LOW VENOUS*L*
1 series · 14 of 24 positions shown · non-contrast
Comparison: None

CLINICAL DATA: Left ankle pain x1 week. Edema. History of bilateral
DVT 5 years ago. history of gsv ablations.

EXAM:
LEFT LOWER EXTREMITY VENOUS DOPPLER ULTRASOUND
TECHNIQUE: Gray-scale sonography with compression, as well as color and duplex
ultrasound, were performed to evaluate the deep venous system from
the level of the common femoral vein through the popliteal and
proximal calf veins.

[Series 1: us extrem low venous*left* · 14 of 29 slices shown]
[im 1/29]
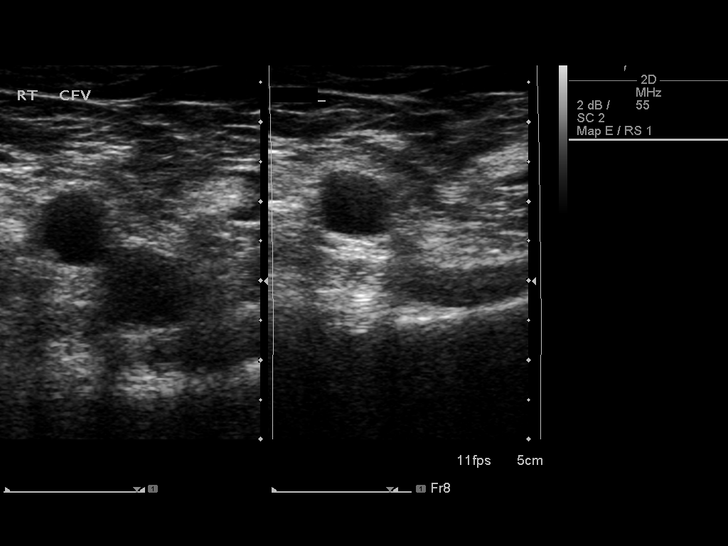
[im 3/29]
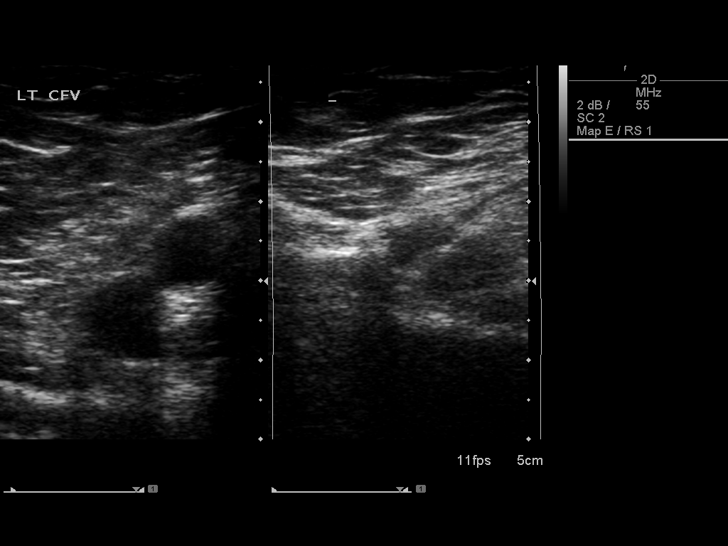
[im 5/29]
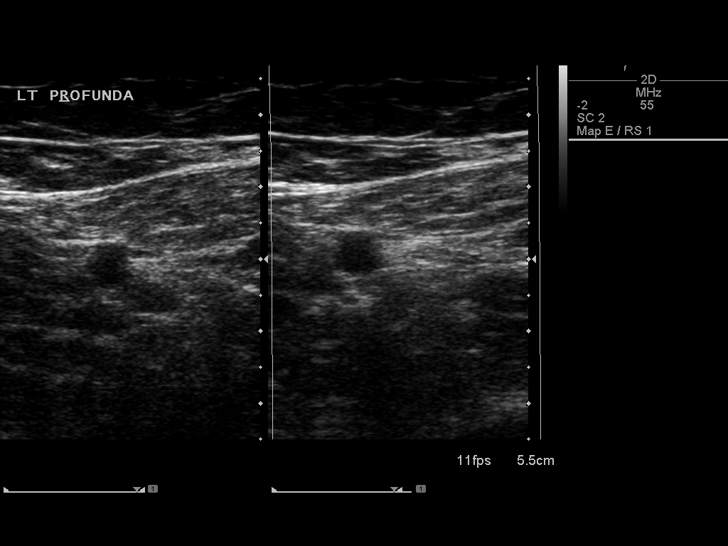
[im 8/29]
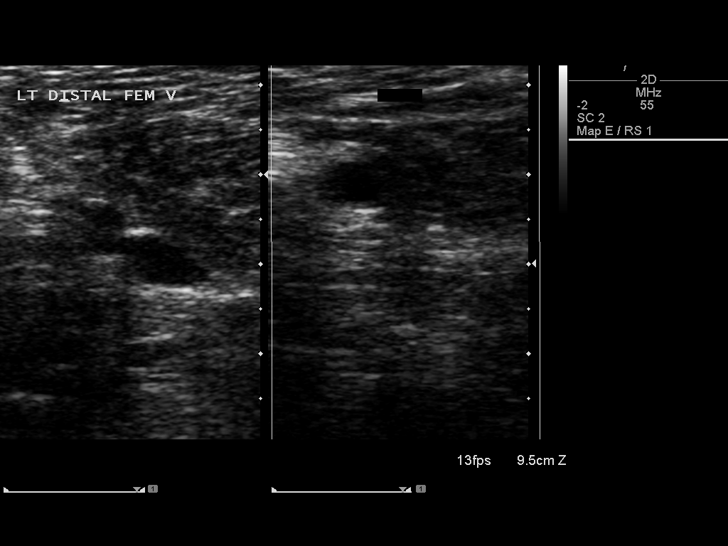
[im 9/29]
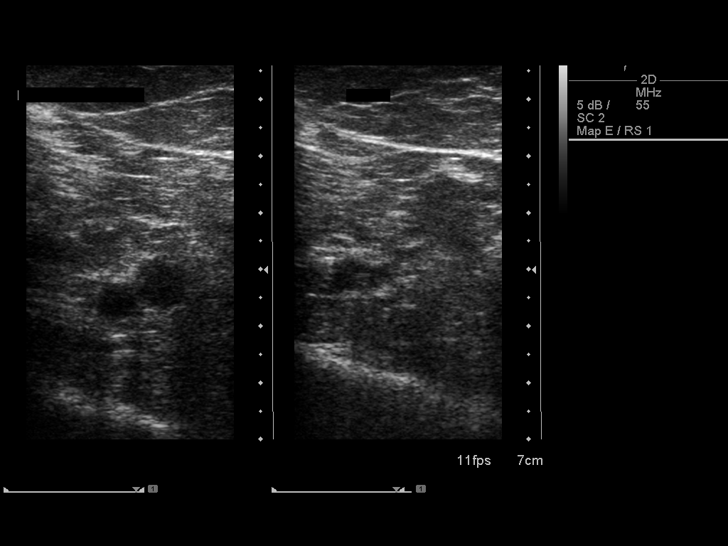
[im 11/29]
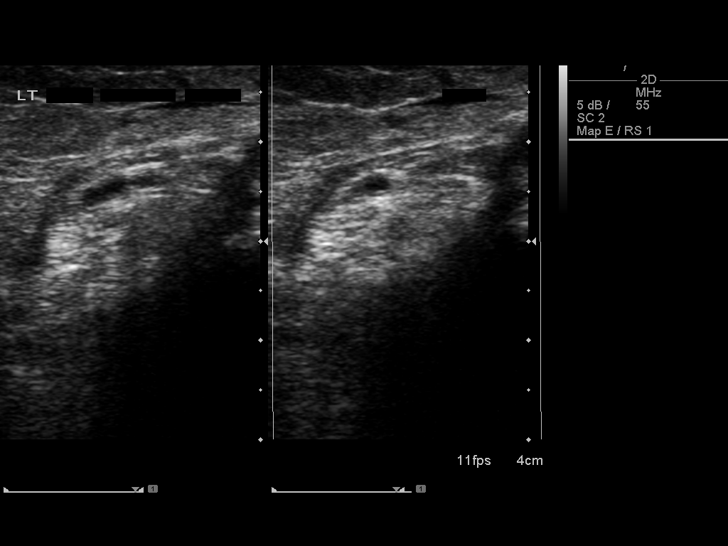
[im 14/29]
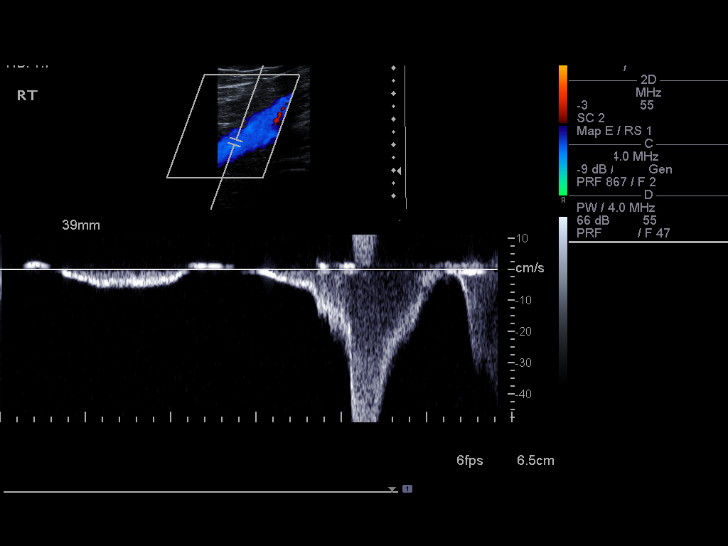
[im 15/29]
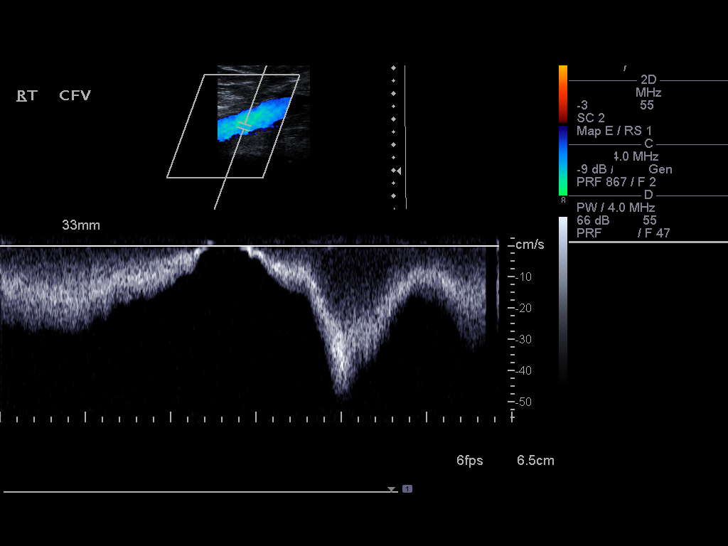
[im 18/29]
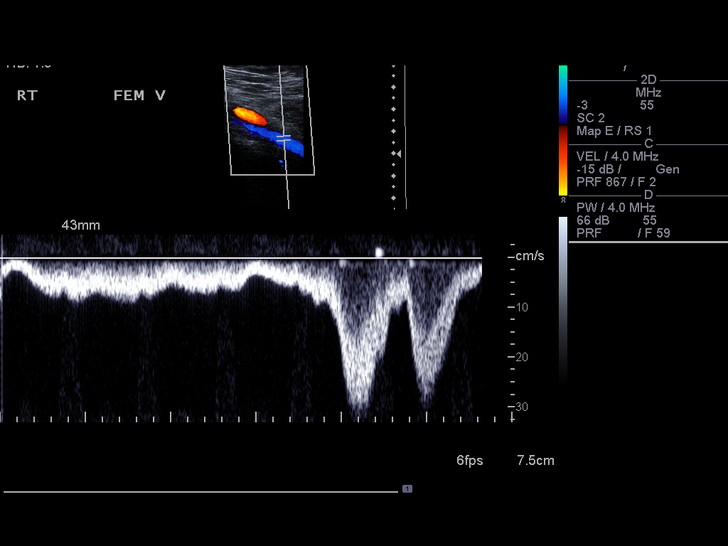
[im 20/29]
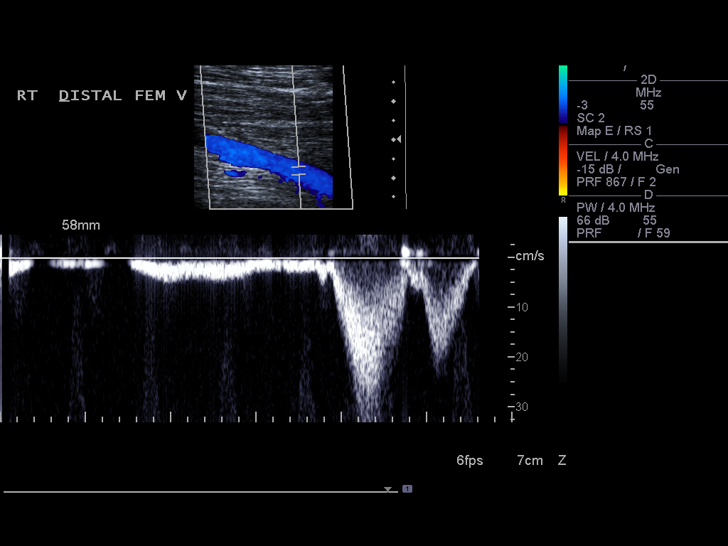
[im 22/29]
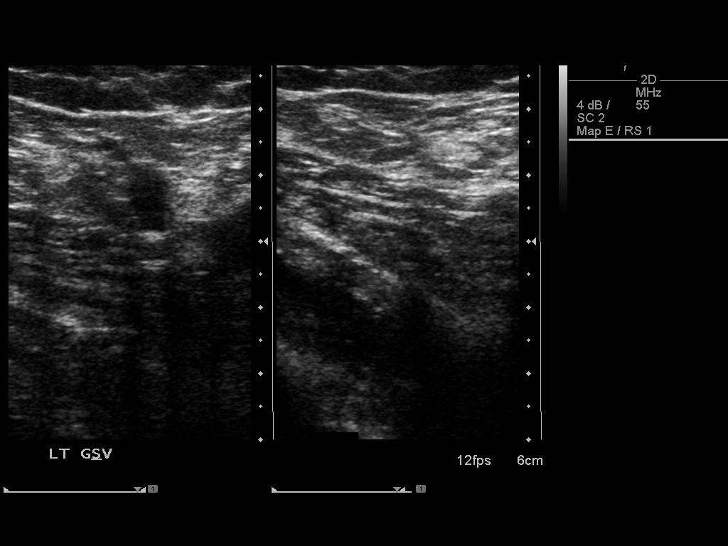
[im 24/29]
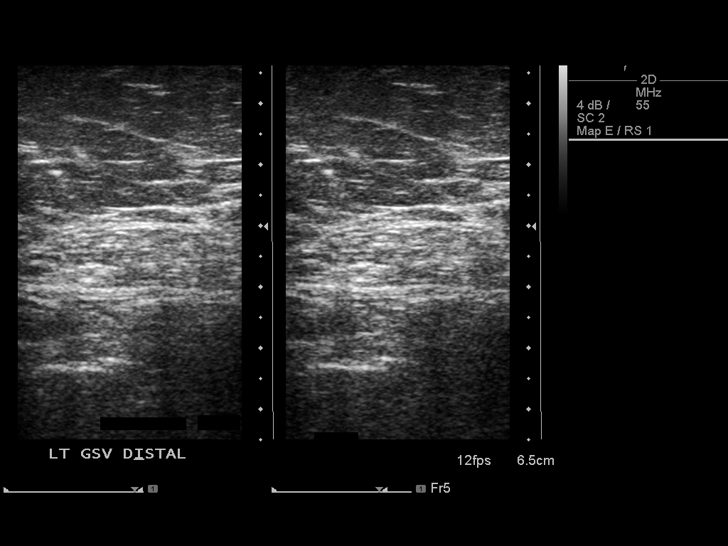
[im 26/29]
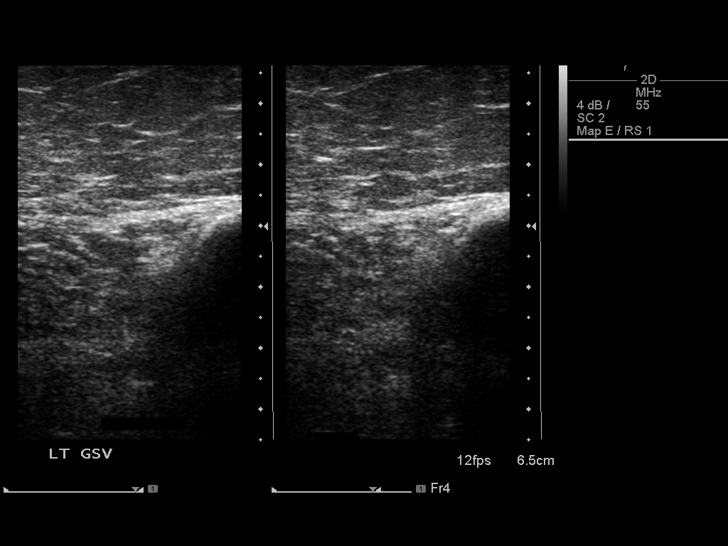
[im 29/29]
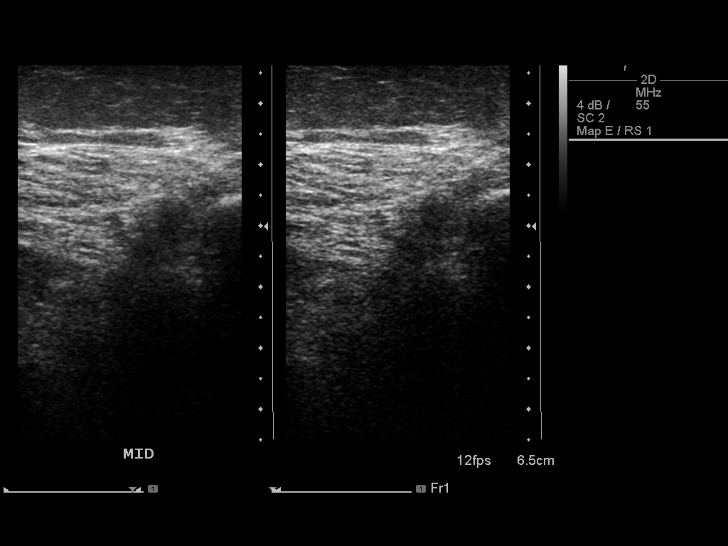

[14 of 24 positions shown; findings below may reference images not displayed]

FINDINGS: Normal compressibility of the common femoral, superficial femoral,
and popliteal veins, as well as the proximal calf veins. No filling
defects to suggest DVT on grayscale or color Doppler imaging.
Doppler waveforms show normal direction of venous flow, normal
respiratory phasicity and response to augmentation. Visualized
segments of the saphenous venous system normal in caliber and
compressibility. Survey views of the contralateral common femoral
vein are unremarkable.
IMPRESSION: 1. No evidence of lower extremity deep vein thrombosis, LEFT.

## 2016-10-13 ENCOUNTER — Other Ambulatory Visit: Payer: Self-pay

## 2016-10-13 IMAGING — CR DG ANKLE COMPLETE 3+V*L*
3 series · 3 of 3 positions shown · non-contrast
Comparison: 12/19/2016

CLINICAL DATA: Left ankle pain

EXAM:
LEFT ANKLE COMPLETE - 3+ VIEW

[AP]
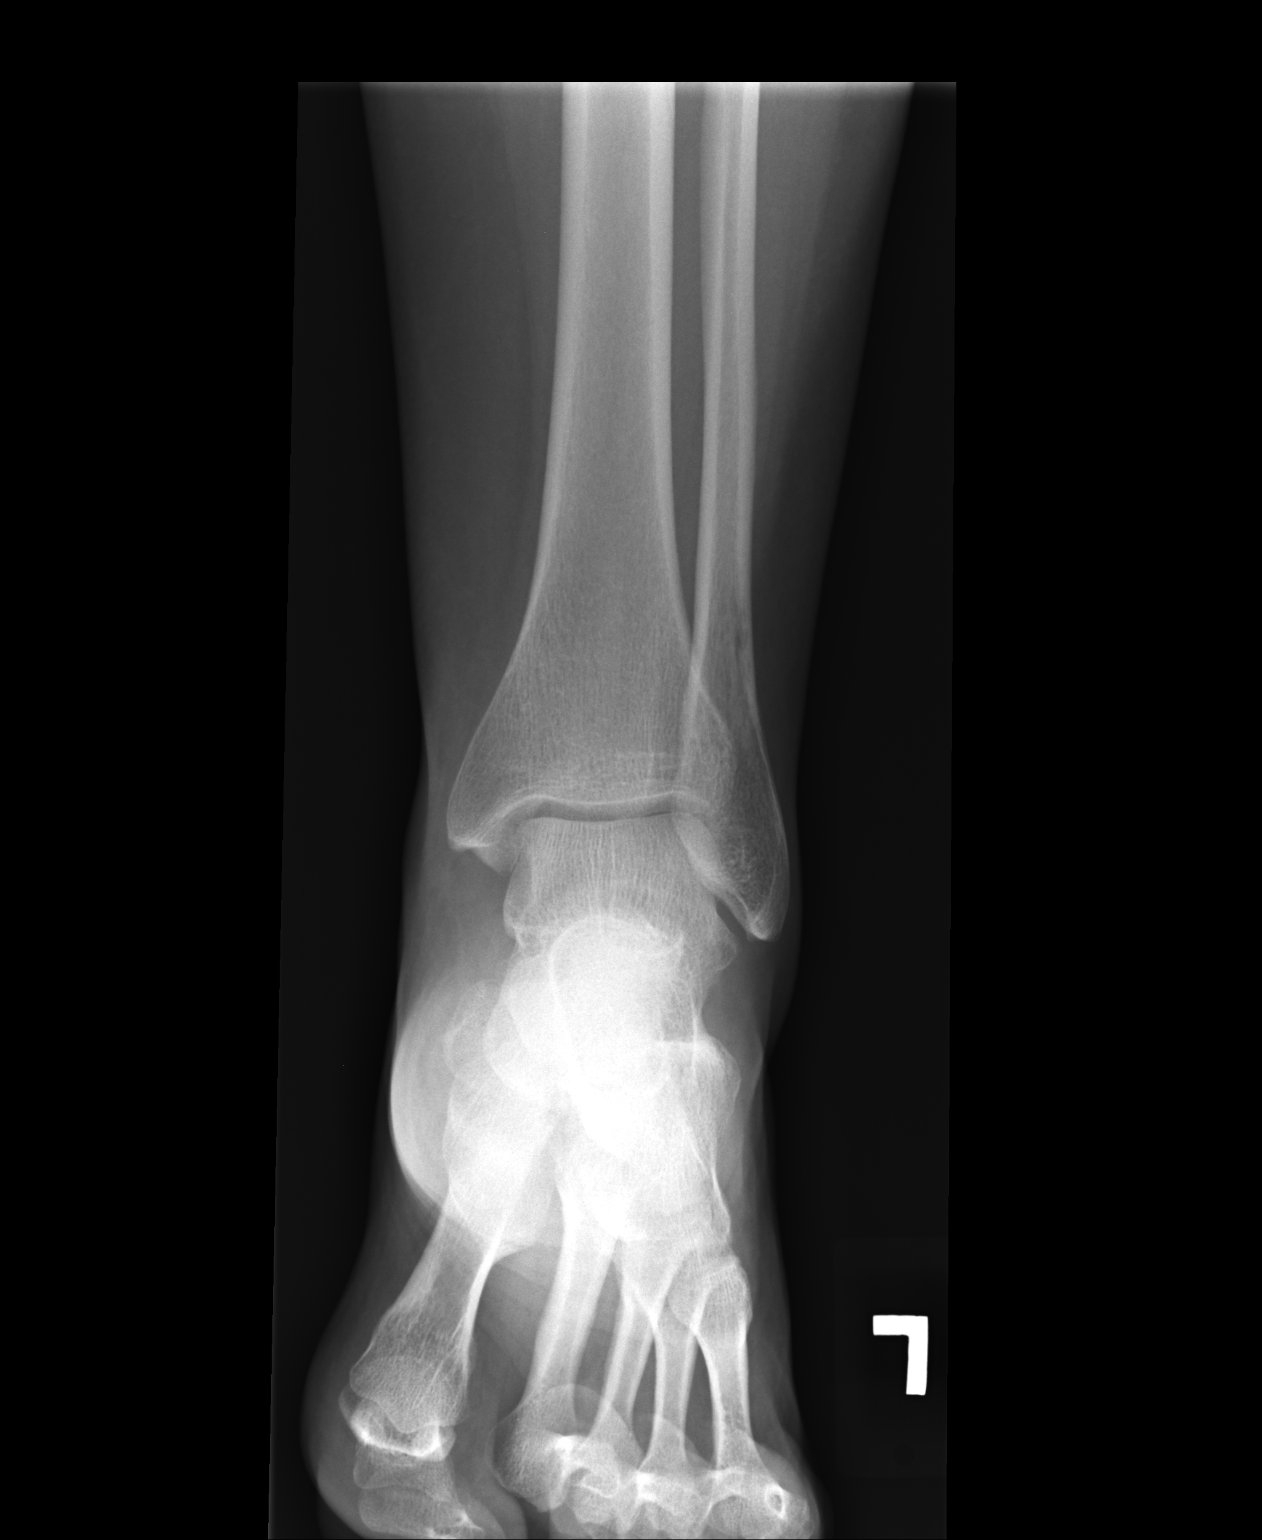

[lateral]
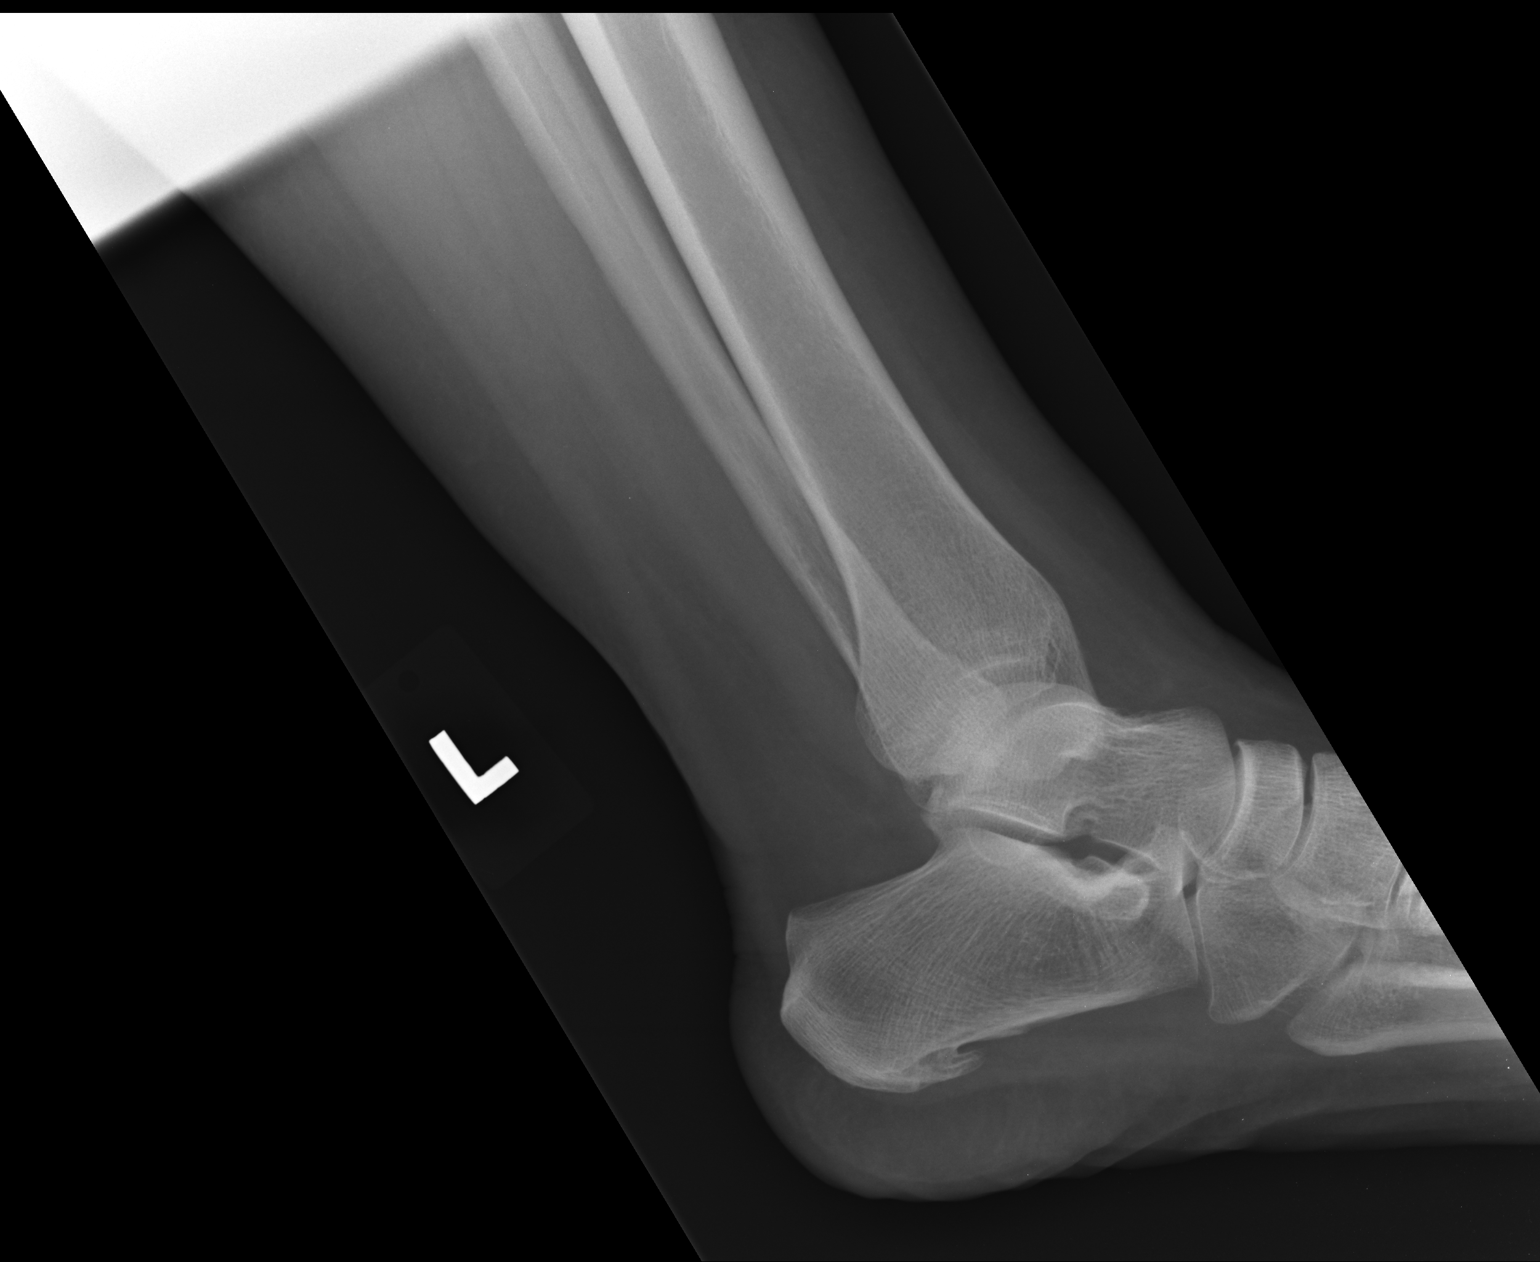

[oblique]
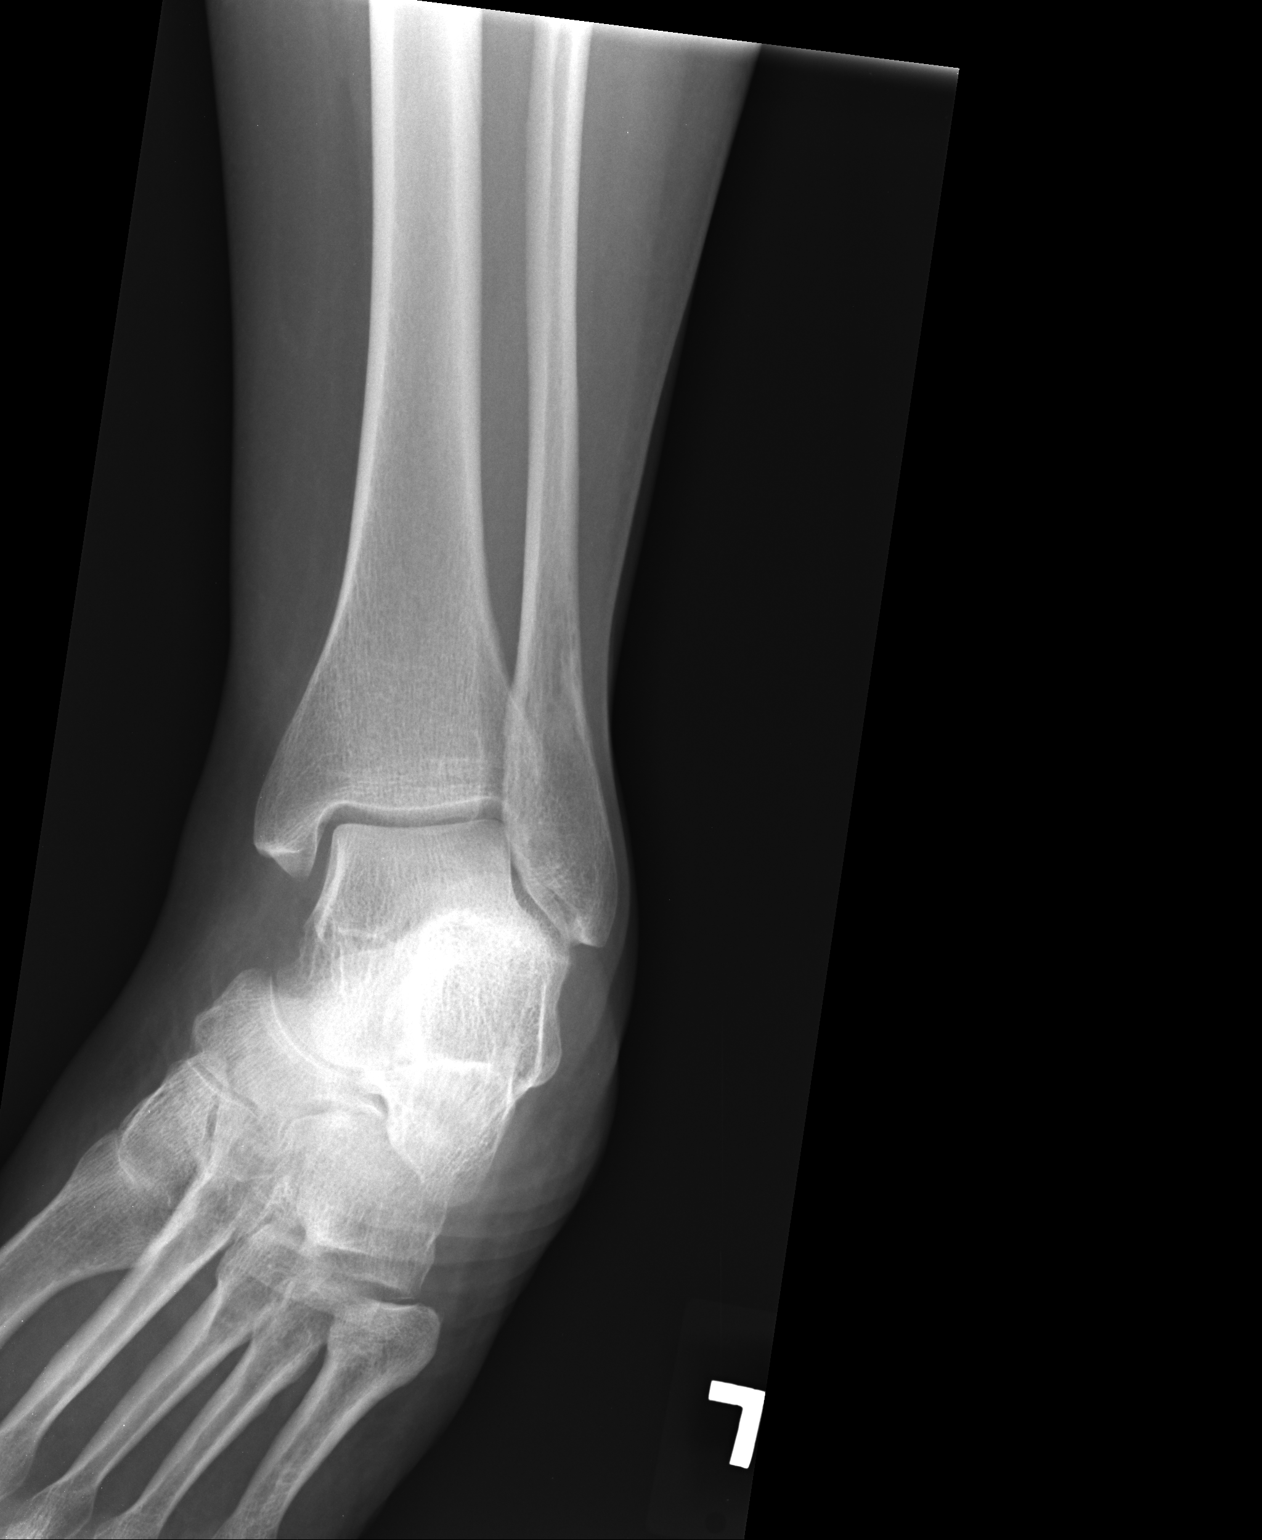

[3 of 3 positions shown; findings below may reference images not displayed]

FINDINGS: There is no evidence of fracture, dislocation, or joint effusion.
There is no evidence of arthropathy or other focal bone abnormality.
Small plantar heel spur noted. Soft tissues are unremarkable.
IMPRESSION: 1. No acute findings identified.

## 2016-10-13 NOTE — Telephone Encounter (Signed)
Called pharmacy with okay to switch from tablet to capsule

## 2016-10-13 NOTE — Telephone Encounter (Signed)
Fax received from [facility] requesting to switch Fluoxetine tablets to Fluoxetine capsules with the same signature w/ #180 and 2 refills? Please advise

## 2016-10-13 NOTE — Telephone Encounter (Signed)
Ok for the change from tablet to capsule for Prozac.

## 2017-01-17 ENCOUNTER — Ambulatory Visit: Payer: BLUE CROSS/BLUE SHIELD | Admitting: Nurse Practitioner

## 2017-01-22 ENCOUNTER — Ambulatory Visit (INDEPENDENT_AMBULATORY_CARE_PROVIDER_SITE_OTHER): Payer: Managed Care, Other (non HMO)

## 2017-01-22 ENCOUNTER — Encounter: Payer: Self-pay | Admitting: Family Medicine

## 2017-01-22 ENCOUNTER — Ambulatory Visit (INDEPENDENT_AMBULATORY_CARE_PROVIDER_SITE_OTHER): Payer: Managed Care, Other (non HMO) | Admitting: Family Medicine

## 2017-01-22 VITALS — BP 139/82 | HR 70 | Temp 98.0°F | Resp 16 | Ht 67.0 in | Wt 220.0 lb

## 2017-01-22 DIAGNOSIS — S93401A Sprain of unspecified ligament of right ankle, initial encounter: Secondary | ICD-10-CM

## 2017-01-22 DIAGNOSIS — M25571 Pain in right ankle and joints of right foot: Secondary | ICD-10-CM

## 2017-01-22 MED ORDER — HYDROCODONE-ACETAMINOPHEN 5-325 MG PO TABS: 1.0000 | ORAL_TABLET | Freq: Four times a day (QID) | ORAL | 0 refills | Status: DC | PRN

## 2017-01-22 NOTE — Patient Instructions (Addendum)
Your xrays today looked good.  Like we talked about, sometimes there can be a small fracture there on the outside of your foot that doesn't show up in the first 24 hours.   Keep using ice, compression, elevation, and rest today.  Use the ankle brace to help you walk around.    Take the Norco for severe pain.  Otherwise use Tylenol.  Come back so we can re-examine you in the middle of the week next week.  If it's much worse, do not wait and come back sooner.     IF you received an x-ray today, you will receive an invoice from [facility]. Please contact [facility] at [phone] with questions or concerns regarding your invoice.   IF you received labwork today, you will receive an invoice from [facility]. Please contact [facility] at [phone] with questions or concerns regarding your invoice.   Our billing staff will not be able to assist you with questions regarding bills from these companies.  You will be contacted with the lab results as soon as they are available. The fastest way to get your results is to activate your My Chart account. Instructions are located on the last page of this paperwork. If you have not heard from us regarding the results in 2 weeks, please contact this office.

## 2017-01-22 NOTE — Progress Notes (Signed)
Katherine Adams is a 56 y.o. female who presents [facility] today for Right ankle pain:  1.  Right ankle pain:  [place] and twisted her Right ankle. Occurred about 7 hours ago this AM Had immediate pain but was able to get up and bear weight.  Pain was located on lateral aspect of Right ankle.  Drove home, but was painful when pushing down on gas pedal. Unloaded her car, still able to bear weight, but pain has worsened and therefore she came in to be evaluated.    Has noted some swelling and bruising to Right side of foot/ankle.  Pain worse when bearing weight.  Has taken tylenol without much relief.  She is unable to take NSAIDs or tramadol due to allergic reactions.  ROS as above.    PMH reviewed. Patient is a nonsmoker.   Past Medical History:  Diagnosis Date  . Acne vulgaris   . Allergic rhinitis, cause unspecified   . Bursitis 2004   left hip  . DJD (degenerative joint disease) of knee    Right knee  . DVT (deep venous thrombosis) (HCC) 02/25/2010   Bilateral LE, after stress fx of foot  . Headache(784.0)    migraines  . Onychomycosis 2012  . Plantar fasciitis 2004   bilateral  . Scoliosis 2004   mild   Past Surgical History:  Procedure Laterality Date  . TONSILLECTOMY AND ADENOIDECTOMY    . TUBAL LIGATION Bilateral 11/1994  . WISDOM TOOTH EXTRACTION  age 56   and removal of 4 other teeth - braces X 2    Medications reviewed. Current Outpatient Prescriptions  Medication Sig Dispense Refill  . ACETAMINOPHEN PO Take by mouth as needed.    Marland Kitchen. aspirin EC 81 MG tablet Take 81 mg by mouth daily.    . B Complex-C (SUPER B COMPLEX PO) Take by mouth daily.    . calcium carbonate (OS-CAL) 600 MG TABS Take 600 mg by mouth 2 (two) times daily with a meal.      . cetirizine (ZYRTEC) 10 MG tablet Take 10 mg by mouth daily.      . Cholecalciferol (VITAMIN D) 2000 UNITS tablet Take 2,000 Units by mouth 2 (two)  times daily.    Marland Kitchen. docusate sodium (COLACE) 100 MG capsule Take 100 mg by mouth 2 (two) times daily.    Marland Kitchen. FLUoxetine (PROZAC) 10 MG tablet Take 1 tablet of 10 mg twice a day 180 tablet 4  . fluticasone (FLONASE) 50 MCG/ACT nasal spray USE 2 SPRAYS IN EACH NOSTRIL DAILY AS NEEDED FOR ALLERGIES 16 g 10  . glucosamine-chondroitin 500-400 MG tablet Take 1 tablet by mouth 2 (two) times daily.    . magnesium gluconate (MAGONATE) 500 MG tablet Take 500 mg by mouth 2 (two) times daily.     . Misc Natural Products (TART CHERRY ADVANCED PO) Take 1,200 mg by mouth daily.    . Multiple Vitamins-Minerals (MULTIVITAMIN PO) Take by mouth daily.       No current facility-administered medications for this visit.      Physical Exam:  BP 139/82 (BP Location: Right Arm, Patient Position: Sitting, Cuff Size: Normal)   Pulse 70   Temp 98 F (36.7 C) (Oral)   Resp 16   Ht 5\' 7"  (1.702 m)   Wt 220 lb (99.8 kg)   LMP 12/21/2013 (Exact Date)   SpO2 98%   BMI 34.46  kg/m  Gen:  Alert, cooperative patient who appears stated age in no acute distress.  Vital signs reviewed. HEENT: EOMI,  MMM Pulm:  Clear to auscultation bilaterally with good air movement.  No wheezes or rales noted.   Cardiac:  Regular rate and rhythm without murmur auscultated.  Good S1/S2. Abd:  Soft/nondistended/nontender.  Good bowel sounds throughout all four quadrants.  No masses noted.  MSK:  - Left ankle:  WNL - Right ankle:  On inspection she has some edema along her lateral malleolus of her right ankle. Also some bruising just inferior to the right lateral malleolus.No syndesmotic pain.  Tender to palpation along areas of edema and bruising. She also some mild tenderness along the fifth metatarsal. Dorsiflexion and plantarflexion are 4 out of 5 limited somewhat by pain around her ankle. She has reproduction of pain with inversion of her ankle.  Good cap refill all toes. Sensation intact to all toes. Anterior and posterior drawer tests are  negative. I can get a good endpoint when testing her ligaments                                                        Assessment and Plan:  1.  Ankle sprain:  -radiographs are negative for any fracture. -She does have fair amount of swelling.  Treating with RICE therapy -placing an ASO brace today. She has a postop shoe already from prior stress fracture. She plans to wear this for relief along with ASO brace. -Return to clinic within the next week for likely repeat radiographs if she continues to have pain along the fifth metatarsal. She just had her fall earlier today and there occasionally Jones fractures that do not appear within the first 24 hours after injury. - pain relief with 1/2 tabs of Norco.  She cannot tolerate Tramadol or NSAIDs

## 2017-01-28 ENCOUNTER — Other Ambulatory Visit: Payer: Self-pay | Admitting: Nurse Practitioner

## 2017-01-28 DIAGNOSIS — Z1231 Encounter for screening mammogram for malignant neoplasm of breast: Secondary | ICD-10-CM

## 2017-02-21 ENCOUNTER — Ambulatory Visit
Admission: RE | Admit: 2017-02-21 | Discharge: 2017-02-21 | Disposition: A | Discharge status: Home / Self Care | Payer: Managed Care, Other (non HMO) | Source: Ambulatory Visit | Attending: Nurse Practitioner | Admitting: Nurse Practitioner

## 2017-02-21 DIAGNOSIS — Z1231 Encounter for screening mammogram for malignant neoplasm of breast: Secondary | ICD-10-CM

## 2017-02-22 NOTE — Progress Notes (Signed)
Patient ID: Katherine Adams, female   DOB: 1960/09/11, 56 y.o.   MRN: [id number]  56 y.o. [id number] Married  Caucasian Fe here for annual exam.  No new health problems.  No further PMB since 03/22/16.  The evaluation with  EMB & PUS was negative.  She and husband are doing better and working on their marriage.  Patient's last menstrual period was 12/21/2013 (exact date).          Sexually active: Yes.    The current method of family planning is tubal ligation and post menopausal status.    Exercising: No.  The patient does not participate in regular exercise at present. Smoker:  no  Health Maintenance: Pap: 03/23/16, Negative  01/09/15, Negative with neg HR HPV  10/12/11, Negative with neg HR HPV History of Abnormal Pap: no MMG: 02/21/17, 3D-no, Density Category B, Bi-Rads 1:  Negative Self Breast exams: yes Colonoscopy: 2014, (inconclusive) Dr. Loreta AveMann, overdue for repeat, interested in Cologuard BMD: 10/21/10 Z Score: 0.3 Spine / 0.4 Left Femur Neck TDaP: 06/03/14 Hep C and HIV: 01/13/16 Labs: Fasting labs today   reports that she has never smoked. She has never used smokeless tobacco. She reports that she drinks about 0.6 - 1.2 oz of alcohol per week . She reports that she does not use drugs.  Past Medical History:  Diagnosis Date  . Acne vulgaris   . Allergic rhinitis, cause unspecified   . Bursitis 2004   left hip  . DJD (degenerative joint disease) of knee    Right knee  . DVT (deep venous thrombosis) (HCC) 02/25/2010   Bilateral LE, after stress fx of foot  . Headache(784.0)    migraines  . Onychomycosis 2012  . Plantar fasciitis 2004   bilateral  . Scoliosis 2004   mild    Past Surgical History:  Procedure Laterality Date  . TONSILLECTOMY AND ADENOIDECTOMY    . TUBAL LIGATION Bilateral 11/1994  . WISDOM TOOTH EXTRACTION  age 56   and removal of 4 other teeth - braces X 2    Current Outpatient Prescriptions  Medication Sig Dispense Refill  . ACETAMINOPHEN PO Take by mouth  as needed.    Marland Kitchen. aspirin EC 81 MG tablet Take 81 mg by mouth daily.    . B Complex-C (SUPER B COMPLEX PO) Take by mouth daily.    . calcium carbonate (OS-CAL) 600 MG TABS Take 600 mg by mouth 2 (two) times daily with a meal.      . cetirizine (ZYRTEC) 10 MG tablet Take 10 mg by mouth daily.      . Cholecalciferol (VITAMIN D3) 1000 units CAPS Take 1 capsule by mouth daily.    Marland Kitchen. docusate sodium (COLACE) 100 MG capsule Take 100 mg by mouth 2 (two) times daily.    Marland Kitchen. FLUoxetine (PROZAC) 10 MG tablet Take 1 tablet of 10 mg twice a day 180 tablet 4  . fluticasone (FLONASE) 50 MCG/ACT nasal spray USE 2 SPRAYS IN EACH NOSTRIL DAILY AS NEEDED FOR ALLERGIES 16 g 10  . glucosamine-chondroitin 500-400 MG tablet Take 1 tablet by mouth 2 (two) times daily.    Marland Kitchen. HYDROcodone-acetaminophen (NORCO) 5-325 MG tablet Take 1 tablet by mouth every 6 (six) hours as needed for moderate pain. 10 tablet 0  . magnesium gluconate (MAGONATE) 500 MG tablet Take 500 mg by mouth daily.     . Misc Natural Products (TART CHERRY ADVANCED PO) Take 1,200 mg by mouth daily.    . Multiple Vitamins-Minerals (MULTIVITAMIN  PO) Take by mouth daily.       No current facility-administered medications for this visit.     Family History  Problem Relation Age of Onset  . Diabetes Mother   . Heart disease Mother   . Thyroid disease Mother   . Hyperlipidemia Mother   . Hyperlipidemia Father   . Diabetes Maternal Aunt   . Stroke Maternal Grandmother   . Stroke Maternal Grandfather   . COPD Paternal Grandfather   . Diabetes Maternal Aunt   . Breast cancer Cousin     ROS:  Pertinent items are noted in HPI.  Otherwise, a comprehensive ROS was negative.  Exam:   BP 100/60 (BP Location: Right Arm, Patient Position: Sitting, Cuff Size: Large)   Pulse 68   Ht 5' 7.25" (1.708 m)   Wt 219 lb (99.3 kg)   LMP 12/21/2013 (Exact Date)   BMI 34.05 kg/m  Height: 5' 7.25" (170.8 cm) Ht Readings from Last 3 Encounters:  02/23/17 5' 7.25"  (1.708 m)  01/22/17 5\' 7"  (1.702 m)  04/06/16 5' 7.5" (1.715 m)    General appearance: alert, cooperative and appears stated age Head: Normocephalic, without obvious abnormality, atraumatic Neck: no adenopathy, supple, symmetrical, trachea midline and thyroid normal to inspection and palpation Lungs: clear to auscultation bilaterally Breasts: normal appearance, no masses or tenderness Heart: regular rate and rhythm Abdomen: soft, non-tender; no masses,  no organomegaly Extremities: extremities normal, atraumatic, no cyanosis or edema Skin: Skin color, texture, turgor normal. No rashes or lesions Lymph nodes: Cervical, supraclavicular, and axillary nodes normal. No abnormal inguinal nodes palpated Neurologic: Grossly normal   Pelvic: External genitalia:  no lesions              Urethra:  normal appearing urethra with no masses, tenderness or lesions              Bartholin's and Skene's: normal                 Vagina: normal appearing vagina with normal color and discharge, no lesions              Cervix: anteverted              Pap taken: Yes.   Bimanual Exam:  Uterus:  normal size, contour, position, consistency, mobility, non-tender              Adnexa: no mass, fullness, tenderness               Rectovaginal: Confirms               Anus:  normal sphincter tone, no lesions  Chaperone present: no  A:  Well Woman with normal exam  Postmenopausal  PMB 03/22/16 with negative EMB and PUS  Situational stressors are some better   P:   Reviewed health and wellness pertinent to exam  Pap smear: no  Mammogram is due 6/19  Follow with labs  She will consult with Dr. Loreta Ave about colonoscopy vs Cologard  Counseled on breast self exam, mammography screening, adequate intake of calcium and vitamin D, diet and exercise return annually or prn  An After Visit Summary was printed and given to the patient.

## 2017-02-23 ENCOUNTER — Encounter: Payer: Self-pay | Admitting: Nurse Practitioner

## 2017-02-23 ENCOUNTER — Ambulatory Visit (INDEPENDENT_AMBULATORY_CARE_PROVIDER_SITE_OTHER): Payer: Managed Care, Other (non HMO) | Admitting: Nurse Practitioner

## 2017-02-23 VITALS — BP 100/60 | HR 68 | Ht 67.25 in | Wt 219.0 lb

## 2017-02-23 DIAGNOSIS — Z0000 Encounter for general adult medical examination without abnormal findings: Secondary | ICD-10-CM | POA: Diagnosis not present

## 2017-02-23 DIAGNOSIS — Z01419 Encounter for gynecological examination (general) (routine) without abnormal findings: Secondary | ICD-10-CM | POA: Diagnosis not present

## 2017-02-23 NOTE — Patient Instructions (Signed)

## 2017-02-23 NOTE — Progress Notes (Signed)
Encounter reviewed Jill Jertson, MD   

## 2017-02-24 LAB — CBC
HEMOGLOBIN: 12.6 g/dL (ref 11.1–15.9)
Hematocrit: 38.6 % (ref 34.0–46.6)
MCH: 29.3 pg (ref 26.6–33.0)
MCHC: 32.6 g/dL (ref 31.5–35.7)
MCV: 90 fL (ref 79–97)
Platelets: 263 10*3/uL (ref 150–379)
RBC: 4.3 x10E6/uL (ref 3.77–5.28)
RDW: 14.2 % (ref 12.3–15.4)
WBC: 5.9 10*3/uL (ref 3.4–10.8)

## 2017-02-24 LAB — TSH: TSH: 0.936 u[IU]/mL (ref 0.450–4.500)

## 2017-02-24 LAB — LIPID PANEL
Chol/HDL Ratio: 2.9 ratio (ref 0.0–4.4)
Cholesterol, Total: 179 mg/dL (ref 100–199)
HDL: 62 mg/dL (ref >39)
LDL CALC: 101 mg/dL — AB (ref 0–99)
Triglycerides: 78 mg/dL (ref 0–149)
VLDL CHOLESTEROL CAL: 16 mg/dL (ref 5–40)

## 2017-02-24 LAB — VITAMIN D 25 HYDROXY (VIT D DEFICIENCY, FRACTURES): VIT D 25 HYDROXY: 53.3 ng/mL (ref 30.0–100.0)

## 2017-02-24 LAB — COMPREHENSIVE METABOLIC PANEL
ALK PHOS: 71 IU/L (ref 39–117)
ALT: 15 IU/L (ref 0–32)
AST: 21 IU/L (ref 0–40)
Albumin/Globulin Ratio: 1.9 (ref 1.2–2.2)
Albumin: 4.4 g/dL (ref 3.5–5.5)
BUN/Creatinine Ratio: 16 (ref 9–23)
BUN: 15 mg/dL (ref 6–24)
Bilirubin Total: 0.4 mg/dL (ref 0.0–1.2)
CO2: 23 mmol/L (ref 20–29)
CREATININE: 0.94 mg/dL (ref 0.57–1.00)
Calcium: 9.3 mg/dL (ref 8.7–10.2)
Chloride: 103 mmol/L (ref 96–106)
GFR calc Af Amer: 79 mL/min/{1.73_m2} (ref >59)
GFR calc non Af Amer: 69 mL/min/{1.73_m2} (ref >59)
GLUCOSE: 90 mg/dL (ref 65–99)
Globulin, Total: 2.3 g/dL (ref 1.5–4.5)
Potassium: 4.4 mmol/L (ref 3.5–5.2)
Sodium: 141 mmol/L (ref 134–144)
Total Protein: 6.7 g/dL (ref 6.0–8.5)

## 2017-03-30 ENCOUNTER — Telehealth: Payer: Self-pay | Admitting: Obstetrics and Gynecology

## 2017-03-30 NOTE — Telephone Encounter (Signed)
Left message for patient to call to reschedule her Patty Grubb appointment °

## 2017-04-20 ENCOUNTER — Telehealth: Payer: Self-pay | Admitting: Obstetrics and Gynecology

## 2017-04-20 MED ORDER — FLUOXETINE HCL 10 MG PO TABS: ORAL_TABLET | ORAL | 3 refills | Status: DC

## 2017-04-20 NOTE — Telephone Encounter (Signed)
Please inform the patient the script was sent

## 2017-04-20 NOTE — Telephone Encounter (Signed)
Medication refill request: Prozac  Last AEX:  02-23-17  Next AEX: not scheduled Last MMG (if hormonal medication request): 02-22-17 WNL  Refill authorized: please advise

## 2017-04-20 NOTE — Telephone Encounter (Signed)
Patient is requesting refills of Fluoxetine.[facility].

## 2017-04-21 NOTE — Telephone Encounter (Signed)
Left message letting patient know that RX has been sent in -eh

## 2017-05-28 ENCOUNTER — Ambulatory Visit (INDEPENDENT_AMBULATORY_CARE_PROVIDER_SITE_OTHER): Payer: Managed Care, Other (non HMO) | Admitting: Physician Assistant

## 2017-05-28 DIAGNOSIS — Z23 Encounter for immunization: Secondary | ICD-10-CM | POA: Diagnosis not present

## 2017-05-31 ENCOUNTER — Ambulatory Visit: Payer: Managed Care, Other (non HMO) | Admitting: Physician Assistant

## 2017-07-28 HISTORY — PX: KNEE ARTHROSCOPY: SUR90

## 2017-09-06 DIAGNOSIS — M25561 Pain in right knee: Secondary | ICD-10-CM | POA: Insufficient documentation

## 2017-12-05 ENCOUNTER — Encounter: Payer: Self-pay | Admitting: Physician Assistant

## 2018-01-11 ENCOUNTER — Encounter: Payer: Self-pay | Admitting: Physician Assistant

## 2018-01-11 ENCOUNTER — Ambulatory Visit: Payer: Managed Care, Other (non HMO) | Admitting: Physician Assistant

## 2018-01-11 ENCOUNTER — Other Ambulatory Visit: Payer: Self-pay

## 2018-01-11 VITALS — BP 112/68 | HR 81 | Temp 98.8°F | Resp 16 | Ht 67.25 in | Wt 222.8 lb

## 2018-01-11 DIAGNOSIS — R5383 Other fatigue: Secondary | ICD-10-CM

## 2018-01-11 DIAGNOSIS — J302 Other seasonal allergic rhinitis: Secondary | ICD-10-CM

## 2018-01-11 DIAGNOSIS — R635 Abnormal weight gain: Secondary | ICD-10-CM | POA: Diagnosis not present

## 2018-01-11 DIAGNOSIS — Z1211 Encounter for screening for malignant neoplasm of colon: Secondary | ICD-10-CM

## 2018-01-11 DIAGNOSIS — F4329 Adjustment disorder with other symptoms: Secondary | ICD-10-CM

## 2018-01-11 DIAGNOSIS — R140 Abdominal distension (gaseous): Secondary | ICD-10-CM | POA: Diagnosis not present

## 2018-01-11 MED ORDER — FLUOXETINE HCL 10 MG PO TABS
ORAL_TABLET | ORAL | 3 refills | Status: AC
Start: 2018-01-11 — End: ?

## 2018-01-11 MED ORDER — FLUTICASONE PROPIONATE 50 MCG/ACT NA SUSP: NASAL | 3 refills | Status: DC

## 2018-01-11 NOTE — Patient Instructions (Addendum)
For therapy -- [facility] ([address] [place]) - [phone] [facility] [address] Jerilynn M[place]) - [phone] [facility] - [phone] [facility]) [facility] - [phone] Buena Irish - [phone] Jeanella Flattery - [facility], [phone] [facility]  - [phone] (do not file insurance) Paula Compton [phone] Doyne Keel - [phone] Jeanella Flattery - [facility], [phone] [facility] - [phone]    IF you received an x-ray today, you will receive an invoice from [facility]. Please contact [facility] at [phone] with questions or concerns regarding your invoice.   IF you received labwork today, you will receive an invoice from [facility]. Please contact [facility] at [phone] with questions or concerns regarding your invoice.   Our billing staff will not be able to assist you with questions regarding bills from these companies.  You will be contacted with the lab results as soon as they are available. The fastest way to get your results is to activate your My Chart account. Instructions are located on the last page of this paperwork. If you have not heard from [place] regarding the results in 2 weeks, please contact this office.
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
## 2018-01-12 LAB — T4, FREE: Free T4: 1.3 ng/dL (ref 0.82–1.77)

## 2018-01-12 LAB — URINALYSIS, DIPSTICK ONLY
BILIRUBIN UA: NEGATIVE
Glucose, UA: NEGATIVE
Ketones, UA: NEGATIVE
LEUKOCYTES UA: NEGATIVE
NITRITE UA: NEGATIVE
Protein, UA: NEGATIVE
RBC UA: NEGATIVE
SPEC GRAV UA: 1.018 (ref 1.005–1.030)
UUROB: 0.2 mg/dL (ref 0.2–1.0)
pH, UA: 7 (ref 5.0–7.5)

## 2018-01-12 LAB — CBC WITH DIFFERENTIAL/PLATELET
Basophils Absolute: 0 10*3/uL (ref 0.0–0.2)
Basos: 0 %
EOS (ABSOLUTE): 0.3 10*3/uL (ref 0.0–0.4)
EOS: 3 %
HEMATOCRIT: 42.1 % (ref 34.0–46.6)
Hemoglobin: 13.7 g/dL (ref 11.1–15.9)
IMMATURE GRANULOCYTES: 0 %
Immature Grans (Abs): 0 10*3/uL (ref 0.0–0.1)
LYMPHS ABS: 2.6 10*3/uL (ref 0.7–3.1)
Lymphs: 33 %
MCH: 29.2 pg (ref 26.6–33.0)
MCHC: 32.5 g/dL (ref 31.5–35.7)
MCV: 90 fL (ref 79–97)
MONOS ABS: 0.5 10*3/uL (ref 0.1–0.9)
Monocytes: 7 %
NEUTROS PCT: 57 %
Neutrophils Absolute: 4.5 10*3/uL (ref 1.4–7.0)
PLATELETS: 272 10*3/uL (ref 150–379)
RBC: 4.69 x10E6/uL (ref 3.77–5.28)
RDW: 14.5 % (ref 12.3–15.4)
WBC: 7.9 10*3/uL (ref 3.4–10.8)

## 2018-01-12 LAB — COMPREHENSIVE METABOLIC PANEL
A/G RATIO: 2.2 (ref 1.2–2.2)
ALK PHOS: 89 IU/L (ref 39–117)
ALT: 19 IU/L (ref 0–32)
AST: 21 IU/L (ref 0–40)
Albumin: 5 g/dL (ref 3.5–5.5)
BUN/Creatinine Ratio: 18 (ref 9–23)
BUN: 16 mg/dL (ref 6–24)
Bilirubin Total: 0.2 mg/dL (ref 0.0–1.2)
CALCIUM: 10 mg/dL (ref 8.7–10.2)
CHLORIDE: 104 mmol/L (ref 96–106)
CO2: 22 mmol/L (ref 20–29)
Creatinine, Ser: 0.91 mg/dL (ref 0.57–1.00)
GFR calc Af Amer: 82 mL/min/{1.73_m2} (ref >59)
GFR calc non Af Amer: 71 mL/min/{1.73_m2} (ref >59)
GLOBULIN, TOTAL: 2.3 g/dL (ref 1.5–4.5)
Glucose: 89 mg/dL (ref 65–99)
POTASSIUM: 5.1 mmol/L (ref 3.5–5.2)
Sodium: 143 mmol/L (ref 134–144)
Total Protein: 7.3 g/dL (ref 6.0–8.5)

## 2018-01-12 LAB — TSH: TSH: 1.04 u[IU]/mL (ref 0.450–4.500)

## 2018-01-12 LAB — LIPID PANEL
CHOL/HDL RATIO: 3 ratio (ref 0.0–4.4)
CHOLESTEROL TOTAL: 207 mg/dL — AB (ref 100–199)
HDL: 69 mg/dL (ref >39)
LDL Calculated: 118 mg/dL — ABNORMAL HIGH (ref 0–99)
TRIGLYCERIDES: 98 mg/dL (ref 0–149)
VLDL Cholesterol Cal: 20 mg/dL (ref 5–40)

## 2018-01-12 LAB — T3, FREE: T3 FREE: 2.8 pg/mL (ref 2.0–4.4)

## 2018-01-14 NOTE — Assessment & Plan Note (Signed)
Stable. Continue Flonase 

## 2018-01-19 ENCOUNTER — Telehealth: Payer: Self-pay | Admitting: Physician Assistant

## 2018-01-19 NOTE — Telephone Encounter (Signed)
Copied from CRM [id number]. Topic: Inquiry >> Jan 19, 2018  1:36 PM Maia Petties wrote: Reason for CRM: pt got notification for mychart and assuming it is for lab results. Labs are not notated yet by Chelle. Pt is asking for a call because she is having trouble with her ipad and not able to log into mychart. She will also want a mailed copy of results

## 2018-01-20 NOTE — Telephone Encounter (Signed)
Please advise 

## 2018-01-21 NOTE — Telephone Encounter (Signed)
Lab results are all normal except lipids, which are mildly elevated. Her planned healthy lifestyle changes will likely address the Total cholesterol of 207, and LDL of 118. She could add an OTC Fish OIl supplement (1200 mg daily) if she wants to.  Please send patient a copy of the lab results, per her request.

## 2018-01-24 ENCOUNTER — Encounter: Payer: Self-pay | Admitting: *Deleted

## 2018-01-24 NOTE — Telephone Encounter (Signed)
Letter sent crm in

## 2018-02-28 ENCOUNTER — Ambulatory Visit: Payer: Managed Care, Other (non HMO) | Admitting: Nurse Practitioner

## 2018-03-01 ENCOUNTER — Other Ambulatory Visit: Payer: Self-pay | Admitting: Obstetrics and Gynecology

## 2018-03-01 DIAGNOSIS — Z1231 Encounter for screening mammogram for malignant neoplasm of breast: Secondary | ICD-10-CM

## 2018-03-08 NOTE — Progress Notes (Signed)
57 y.o. [id number] MarriedCaucasianF here for annual exam. Marriage is very strained. Not sexually active. No bleeding.   She doesn't currently feel depressed.     Patient's last menstrual period was 12/21/2013 (exact date).          Sexually active: No.  The current method of family planning is post menopausal.    Exercising: Yes.    walking Smoker:  no  Health Maintenance: Pap:  03/23/16, Negative             01/09/15, Negative with neg HR HPV             10/12/11, Negative with neg HR HPV History of abnormal Pap:  no MMG:  02/21/2017 Birads 1 negative, scheduled today at [facility]  Colonoscopy:  2014 (inconclusive) Dr. Loreta Ave, overdue for repeat. Completed cologuard 01/2018- awaiting results from PCP   BMD:   10/21/2010, normal TDaP:  06/03/2014 Gardasil: N/A   reports that she has never smoked. She has never used smokeless tobacco. She reports that she drinks about 0.6 - 1.2 oz of alcohol per week. She reports that she does not use drugs. Son is 47, estranged. Daughter is 59. She is an Environmental health practitioner.   Past Medical History:  Diagnosis Date  . Acne vulgaris   . Allergic rhinitis, cause unspecified   . Bursitis 2004   left hip  . DJD (degenerative joint disease) of knee    Right knee  . DVT (deep venous thrombosis) (HCC) 02/25/2010   Bilateral LE, after stress fx of foot  . Headache(784.0)    migraines  . Onychomycosis 2012  . Plantar fasciitis 2004   bilateral  . Scoliosis 2004   mild    Past Surgical History:  Procedure Laterality Date  . KNEE ARTHROSCOPY Right 07/28/2017  . TONSILLECTOMY AND ADENOIDECTOMY    . TUBAL LIGATION Bilateral 11/1994  . WISDOM TOOTH EXTRACTION  age 45   and removal of 4 other teeth - braces X 2    Current Outpatient Medications  Medication Sig Dispense Refill  . ACETAMINOPHEN PO Take by mouth as needed.    Marland Kitchen aspirin EC 81 MG tablet Take 81 mg by mouth daily.    . B Complex-C (SUPER B COMPLEX PO) Take by mouth daily.    .  calcium carbonate (OS-CAL) 600 MG TABS Take 600 mg by mouth daily.     . Cetirizine HCl (ZYRTEC ALLERGY) 10 MG CAPS Zyrtec 10 mg capsule  Take by oral route.    . Cholecalciferol (VITAMIN D3) 1000 units CAPS Take 1 capsule by mouth 2 (two) times daily.     . diclofenac sodium (VOLTAREN) 1 % GEL diclofenac 1 % topical gel    . docusate sodium (COLACE) 100 MG capsule Take 100 mg by mouth 2 (two) times daily.    Marland Kitchen FLUoxetine (PROZAC) 10 MG tablet Take 1 tablet of 10 mg twice a day 180 tablet 3  . fluticasone (FLONASE) 50 MCG/ACT nasal spray USE 2 SPRAYS IN EACH NOSTRIL DAILY AS NEEDED FOR ALLERGIES 48 g 3  . glucosamine-chondroitin 500-400 MG tablet Take 1 tablet by mouth daily.     . magnesium gluconate (MAGONATE) 500 MG tablet Take 500 mg by mouth daily.     . Misc Natural Products (TART CHERRY ADVANCED PO) Take 1,200 mg by mouth daily.    . Multiple Vitamins-Minerals (MULTIVITAMIN PO) Take by mouth daily.       No current facility-administered medications for this visit.  Family History  Problem Relation Age of Onset  . Diabetes Mother   . Heart disease Mother   . Thyroid disease Mother   . Hyperlipidemia Mother   . Hyperlipidemia Father   . Diabetes Maternal Aunt   . Stroke Maternal Grandmother   . Stroke Maternal Grandfather   . COPD Paternal Grandfather   . Diabetes Maternal Aunt   . Breast cancer Cousin     Review of Systems  Constitutional: Negative.   HENT: Negative.   Eyes: Negative.   Respiratory: Negative.   Cardiovascular: Negative.   Gastrointestinal: Negative.   Endocrine: Negative.   Genitourinary: Negative.   Musculoskeletal: Negative.   Skin: Negative.   Allergic/Immunologic: Negative.   Neurological: Negative.   Hematological: Negative.   Psychiatric/Behavioral: Negative.     Exam:   BP 108/72 (BP Location: Right Arm, Patient Position: Sitting, Cuff Size: Large)   Pulse 62   Resp 14   Ht 5' 7.5" (1.715 m)   Wt 223 lb (101.2 kg)   LMP 12/21/2013  (Exact Date)   BMI 34.41 kg/m   Weight change: @WEIGHTCHANGE @ Height:   Height: 5' 7.5" (171.5 cm)  Ht Readings from Last 3 Encounters:  03/09/18 5' 7.5" (1.715 m)  01/11/18 5' 7.25" (1.708 m)  02/23/17 5' 7.25" (1.708 m)    General appearance: alert, cooperative and appears stated age Head: Normocephalic, without obvious abnormality, atraumatic Neck: no adenopathy, supple, symmetrical, trachea midline and thyroid normal to inspection and palpation Lungs: clear to auscultation bilaterally Cardiovascular: regular rate and rhythm Breasts: normal appearance, no masses or tenderness Abdomen: soft, non-tender; non distended,  no masses,  no organomegaly Extremities: extremities normal, atraumatic, no cyanosis or edema Skin: Skin color, texture, turgor normal. No rashes or lesions Lymph nodes: Cervical, supraclavicular, and axillary nodes normal. No abnormal inguinal nodes palpated Neurologic: Grossly normal   Pelvic: External genitalia:  no lesions              Urethra:  normal appearing urethra with no masses, tenderness or lesions              Bartholins and Skenes: normal                 Vagina: normal appearing vagina with normal color and discharge, no lesions              Cervix: no lesions               Bimanual Exam:  Uterus:  normal size, contour, position, consistency, mobility, non-tender              Adnexa: no mass, fullness, tenderness               Rectovaginal: Confirms               Anus:  normal sphincter tone, no lesions  Chaperone was present for exam.  A:  Well Woman with normal exam  Marital stress, encouraged counseling  P:   Pap next year  Mammogram today  Cologuard just done, awaiting results  Discussed breast self exam  Discussed calcium and vit D intake

## 2018-03-09 ENCOUNTER — Other Ambulatory Visit: Payer: Self-pay

## 2018-03-09 ENCOUNTER — Ambulatory Visit
Admission: RE | Admit: 2018-03-09 | Discharge: 2018-03-09 | Disposition: A | Discharge status: Home / Self Care | Payer: Managed Care, Other (non HMO) | Source: Ambulatory Visit | Attending: Obstetrics and Gynecology | Admitting: Obstetrics and Gynecology

## 2018-03-09 ENCOUNTER — Encounter: Payer: Self-pay | Admitting: Obstetrics and Gynecology

## 2018-03-09 ENCOUNTER — Ambulatory Visit (INDEPENDENT_AMBULATORY_CARE_PROVIDER_SITE_OTHER): Payer: Managed Care, Other (non HMO) | Admitting: Obstetrics and Gynecology

## 2018-03-09 VITALS — BP 108/72 | HR 62 | Resp 14 | Ht 67.5 in | Wt 223.0 lb

## 2018-03-09 DIAGNOSIS — Z01419 Encounter for gynecological examination (general) (routine) without abnormal findings: Secondary | ICD-10-CM

## 2018-03-09 DIAGNOSIS — Z1231 Encounter for screening mammogram for malignant neoplasm of breast: Secondary | ICD-10-CM

## 2018-03-09 NOTE — Patient Instructions (Signed)

## 2018-03-17 LAB — COLOGUARD: Cologuard: NEGATIVE

## 2018-03-28 ENCOUNTER — Ambulatory Visit: Payer: Managed Care, Other (non HMO)

## 2018-04-10 ENCOUNTER — Ambulatory Visit: Payer: Managed Care, Other (non HMO)

## 2018-06-08 DIAGNOSIS — M79646 Pain in unspecified finger(s): Secondary | ICD-10-CM | POA: Insufficient documentation

## 2018-06-08 DIAGNOSIS — M79643 Pain in unspecified hand: Secondary | ICD-10-CM | POA: Insufficient documentation

## 2018-06-08 DIAGNOSIS — M65341 Trigger finger, right ring finger: Secondary | ICD-10-CM | POA: Insufficient documentation

## 2018-07-07 DIAGNOSIS — M1812 Unilateral primary osteoarthritis of first carpometacarpal joint, left hand: Secondary | ICD-10-CM | POA: Insufficient documentation

## 2018-08-07 DIAGNOSIS — M79645 Pain in left finger(s): Secondary | ICD-10-CM | POA: Insufficient documentation

## 2018-08-10 DIAGNOSIS — G478 Other sleep disorders: Secondary | ICD-10-CM | POA: Insufficient documentation

## 2018-09-14 DIAGNOSIS — R0683 Snoring: Secondary | ICD-10-CM | POA: Insufficient documentation

## 2019-03-19 ENCOUNTER — Other Ambulatory Visit: Payer: Self-pay

## 2019-03-20 NOTE — Progress Notes (Signed)
58 y.o. [id number] Married White or Caucasian Not Hispanic or Latino female here for annual exam.  Not sexually active, strained marriage. She is in therapy, which is helping. Husband with medical complications from his diabetes (still working). He is a Research scientist (medical)consultant, currently working in [employer].   No vaginal bleeding.   She has moments of anxiety and some issues with having motivation to do things outside of the necessities. Feels a little down, emotions are up and down, not full blown depression.     Patient's last menstrual period was 12/21/2013 (exact date).          Sexually active: No.  The current method of family planning is post menopausal status.    Exercising: No.  The patient does not participate in regular exercise at present. Smoker:  no  Health Maintenance: Pap:  03/23/16, Negative 01/09/15, Negative with neg HR HPV History of abnormal Pap:  no MMG:  03/09/2018 Birads 1 negative Colonoscopy: Cologuard 02/27/2018 Negative  BMD:   10/21/2010, normal TDaP:  06/03/2014 Gardasil: N/A   reports that she has never smoked. She has never used smokeless tobacco. She reports current alcohol use of about 1.0 - 2.0 standard drinks of alcohol per week. She reports that she does not use drugs. Son is [id number], estranged. Daughter is [id number], NICU nurse in [place], single. She is an Environmental health practitioneradministrative assistant.   Past Medical History:  Diagnosis Date  . Acne vulgaris   . Allergic rhinitis, cause unspecified   . Bursitis 2004   left hip  . DJD (degenerative joint disease) of knee    Right knee  . DVT (deep venous thrombosis) (HCC) 02/25/2010   Bilateral LE, after stress fx of foot  . Headache(784.0)    migraines  . Onychomycosis 2012  . Plantar fasciitis 2004   bilateral  . Prediabetes   . Scoliosis 2004   mild    Past Surgical History:  Procedure Laterality Date  . KNEE ARTHROSCOPY Right 07/28/2017  . TONSILLECTOMY AND ADENOIDECTOMY    . TUBAL LIGATION Bilateral 11/1994  . WISDOM TOOTH  EXTRACTION  age 58   and removal of 4 other teeth - braces X 2    Current Outpatient Medications  Medication Sig Dispense Refill  . ACETAMINOPHEN PO Take by mouth as needed.    Marland Kitchen. aspirin EC 81 MG tablet Take 81 mg by mouth daily.    . B Complex-C (SUPER B COMPLEX PO) Take by mouth daily.    . calcium carbonate (OS-CAL) 600 MG TABS Take 600 mg by mouth daily.     . Cetirizine HCl (ZYRTEC ALLERGY) 10 MG CAPS Zyrtec 10 mg capsule  Take by oral route.    . Cholecalciferol (VITAMIN D3) 1000 units CAPS Take 1 capsule by mouth 2 (two) times daily.     . diclofenac sodium (VOLTAREN) 1 % GEL diclofenac 1 % topical gel    . FLUoxetine (PROZAC) 10 MG tablet Take 1 tablet of 10 mg twice a day 180 tablet 3  . fluticasone (FLONASE) 50 MCG/ACT nasal spray USE 2 SPRAYS IN EACH NOSTRIL DAILY AS NEEDED FOR ALLERGIES 48 g 3  . glucosamine-chondroitin 500-400 MG tablet Take 1 tablet by mouth daily.     . magnesium gluconate (MAGONATE) 500 MG tablet Take 500 mg by mouth daily.     . Multiple Vitamins-Minerals (MULTIVITAMIN PO) Take by mouth daily.       No current facility-administered medications for this visit.     Family History  Problem Relation Age  of Onset  . Diabetes Mother   . Heart disease Mother   . Thyroid disease Mother   . Hyperlipidemia Mother   . Hyperlipidemia Father   . Diabetes Maternal Aunt   . Stroke Maternal Grandmother   . Stroke Maternal Grandfather   . COPD Paternal Grandfather   . Diabetes Maternal Aunt   . Breast cancer Cousin     Review of Systems  Eyes: Negative.   Respiratory: Negative.   Cardiovascular: Negative.   Gastrointestinal: Negative.   Endocrine: Negative.   Genitourinary: Negative.   Musculoskeletal: Negative.   Skin: Negative.   Allergic/Immunologic: Negative.   Neurological: Negative.   Hematological: Negative.   Psychiatric/Behavioral: Negative.     Exam:   BP 110/72 (BP Location: Right Arm, Patient Position: Sitting, Cuff Size: Normal)    Pulse 76   Temp (!) 97.4 F (36.3 C) (Skin)   Ht 5' 7.32" (1.71 m)   Wt 224 lb (101.6 kg)   LMP 12/21/2013 (Exact Date)   BMI 34.75 kg/m   Weight change: @WEIGHTCHANGE @ Height:   Height: 5' 7.32" (171 cm)  Ht Readings from Last 3 Encounters:  03/21/19 5' 7.32" (1.71 m)  03/09/18 5' 7.5" (1.715 m)  01/11/18 5' 7.25" (1.708 m)    General appearance: alert, cooperative and appears stated age Head: Normocephalic, without obvious abnormality, atraumatic Neck: no adenopathy, supple, symmetrical, trachea midline and thyroid normal to inspection and palpation Lungs: clear to auscultation bilaterally Cardiovascular: regular rate and rhythm Breasts: normal appearance, no masses or tenderness Abdomen: soft, non-tender; non distended,  no masses,  no organomegaly Extremities: extremities normal, atraumatic, no cyanosis or edema Skin: Skin color, texture, turgor normal. No rashes or lesions Lymph nodes: Cervical, supraclavicular, and axillary nodes normal. No abnormal inguinal nodes palpated Neurologic: Grossly normal   Pelvic: External genitalia:  no lesions              Urethra:  normal appearing urethra with no masses, tenderness or lesions              Bartholins and Skenes: normal                 Vagina: normal appearing vagina with normal color and discharge, no lesions              Cervix: no lesions               Bimanual Exam:  Uterus:  normal size, contour, position, consistency, mobility, non-tender and anteverted              Adnexa: no mass, fullness, tenderness               Rectovaginal: Confirms               Anus:  normal sphincter tone, no lesions  Chaperone was present for exam.  A:  Well Woman with normal exam  P:   Pap with hpv  Mammogram due  Cologuard UTD  Discussed breast self exam  Discussed calcium and vit D intake

## 2019-03-21 ENCOUNTER — Ambulatory Visit: Payer: Managed Care, Other (non HMO) | Admitting: Obstetrics and Gynecology

## 2019-03-21 ENCOUNTER — Other Ambulatory Visit: Payer: Self-pay

## 2019-03-21 ENCOUNTER — Other Ambulatory Visit (HOSPITAL_COMMUNITY)
Admission: RE | Admit: 2019-03-21 | Discharge: 2019-03-21 | Disposition: A | Discharge status: Home / Self Care | Payer: Managed Care, Other (non HMO) | Source: Ambulatory Visit | Attending: Obstetrics and Gynecology | Admitting: Obstetrics and Gynecology

## 2019-03-21 ENCOUNTER — Encounter: Payer: Self-pay | Admitting: Obstetrics and Gynecology

## 2019-03-21 VITALS — BP 110/72 | HR 76 | Temp 97.4°F | Ht 67.32 in | Wt 224.0 lb

## 2019-03-21 DIAGNOSIS — Z124 Encounter for screening for malignant neoplasm of cervix: Secondary | ICD-10-CM | POA: Insufficient documentation

## 2019-03-21 DIAGNOSIS — Z01419 Encounter for gynecological examination (general) (routine) without abnormal findings: Secondary | ICD-10-CM

## 2019-03-21 DIAGNOSIS — R7303 Prediabetes: Secondary | ICD-10-CM | POA: Insufficient documentation

## 2019-03-21 NOTE — Patient Instructions (Signed)

## 2019-03-22 LAB — CYTOLOGY - PAP
Diagnosis: NEGATIVE
HPV: NOT DETECTED

## 2019-04-02 ENCOUNTER — Other Ambulatory Visit: Payer: Self-pay

## 2019-04-02 ENCOUNTER — Emergency Department (HOSPITAL_COMMUNITY)
Admission: EM | Admit: 2019-04-02 | Discharge: 2019-04-02 | Disposition: A | Discharge status: Home / Self Care | Payer: Managed Care, Other (non HMO) | Attending: Emergency Medicine | Admitting: Emergency Medicine

## 2019-04-02 DIAGNOSIS — Z7982 Long term (current) use of aspirin: Secondary | ICD-10-CM | POA: Diagnosis not present

## 2019-04-02 DIAGNOSIS — Z79899 Other long term (current) drug therapy: Secondary | ICD-10-CM | POA: Diagnosis not present

## 2019-04-02 DIAGNOSIS — I872 Venous insufficiency (chronic) (peripheral): Secondary | ICD-10-CM | POA: Insufficient documentation

## 2019-04-02 DIAGNOSIS — M79604 Pain in right leg: Secondary | ICD-10-CM | POA: Diagnosis present

## 2019-04-02 MED ORDER — ENOXAPARIN SODIUM 100 MG/ML ~~LOC~~ SOLN
1.0000 mg/kg | Freq: Once | SUBCUTANEOUS | Status: AC
  Administered 2019-04-02: 100 mg via SUBCUTANEOUS
  Filled 2019-04-02: qty 1

## 2019-04-02 NOTE — ED Provider Notes (Signed)
[facility]   CSN: [id number] Arrival date & time: 04/02/19  1900     History   Chief Complaint Chief Complaint  Patient presents with  . Leg Pain    HPI Katherine Adams is a 58 y.o. female who presents with left leg pain.  Past medical history significant for bilateral DVTs 9 years ago.  She states that normally she wears compression stockings however has not been wearing them due to the hot weather.  She has had some heaviness in both legs for a couple days however about 4 days ago she had a severe cramp in the RLE. She walked around to make it better. Since then she has had a pain in the back of her right leg with associated mild swelling.  The pain is constant.  Feels somewhat similar but not as severe as when she had a DVT 9 years ago.  She had some mild redness of the right leg with associated warmth.  She is not currently on any blood thinners.  She denies any chest pain or shortness of breath.  Saw her doctor today who referred her to the ED.   HPI  Past Medical History:  Diagnosis Date  . Acne vulgaris   . Allergic rhinitis, cause unspecified   . Bursitis 2004   left hip  . DJD (degenerative joint disease) of knee    Right knee  . DVT (deep venous thrombosis) (HCC) 02/25/2010   Bilateral LE, after stress fx of foot  . Headache(784.0)    migraines  . Onychomycosis 2012  . Plantar fasciitis 2004   bilateral  . Prediabetes   . Scoliosis 2004   mild    Patient Active Problem List   Diagnosis Date Noted  . Prediabetes   . Chronic venous insufficiency 01/09/2015  . Seasonal allergies   . History of DVT of lower extremity   . DJD (degenerative joint disease) of knee     Past Surgical History:  Procedure Laterality Date  . KNEE ARTHROSCOPY Right 07/28/2017  . TONSILLECTOMY AND ADENOIDECTOMY    . TUBAL LIGATION Bilateral 11/1994  . WISDOM TOOTH EXTRACTION  age 58   and removal of 4 other teeth - braces X 2      OB History    Gravida  2   Para  2   Term  2   Preterm  0   AB  0   Living  2     SAB  0   TAB  0   Ectopic  0   Multiple  0   Live Births  2            Home Medications    Prior to Admission medications   Medication Sig Start Date End Date Taking? Authorizing Provider  ACETAMINOPHEN PO Take by mouth as needed.    [provider]  aspirin EC 81 MG tablet Take 81 mg by mouth daily.    [provider]  B Complex-C (SUPER B COMPLEX PO) Take by mouth daily.    [provider]  calcium carbonate (OS-CAL) 600 MG TABS Take 600 mg by mouth daily.     [provider]  Cetirizine HCl (ZYRTEC ALLERGY) 10 MG CAPS Zyrtec 10 mg capsule  Take by oral route.    [provider]  Cholecalciferol (VITAMIN D3) 1000 units CAPS Take 1 capsule by mouth 2 (two) times daily.     [provider]  diclofenac  sodium (VOLTAREN) 1 % GEL diclofenac 1 % topical gel    [provider]  FLUoxetine (PROZAC) 10 MG tablet Take 1 tablet of 10 mg twice a day 01/11/18   Harrison Mons, PA  fluticasone (FLONASE) 50 MCG/ACT nasal spray USE 2 SPRAYS IN EACH NOSTRIL DAILY AS NEEDED FOR ALLERGIES 01/11/18   Harrison Mons, PA  glucosamine-chondroitin 500-400 MG tablet Take 1 tablet by mouth daily.     [provider]  magnesium gluconate (MAGONATE) 500 MG tablet Take 500 mg by mouth daily.     [provider]  Multiple Vitamins-Minerals (MULTIVITAMIN PO) Take by mouth daily.      [provider]    Family History Family History  Problem Relation Age of Onset  . Diabetes Mother   . Heart disease Mother   . Thyroid disease Mother   . Hyperlipidemia Mother   . Hyperlipidemia Father   . Diabetes Maternal Aunt   . Stroke Maternal Grandmother   . Stroke Maternal Grandfather   . COPD Paternal Grandfather   . Diabetes Maternal Aunt   . Breast cancer Cousin     Social History Social History   Tobacco Use  .  Smoking status: Never Smoker  . Smokeless tobacco: Never Used  Substance Use Topics  . Alcohol use: Yes    Alcohol/week: 1.0 - 2.0 standard drinks    Types: 1 - 2 Glasses of wine per week  . Drug use: No     Allergies   Ibuprofen, Levofloxacin, Tape, Tramadol, Triptans, and Naproxen sodium   Review of Systems Review of Systems  Respiratory: Negative for shortness of breath.   Cardiovascular: Positive for leg swelling. Negative for chest pain.  Musculoskeletal: Positive for myalgias.     Physical Exam Updated Vital Signs BP 129/74   Pulse 65   Temp 97.7 F (36.5 C) (Oral)   Resp 16   LMP 12/21/2013 (Exact Date)   SpO2 99%   Physical Exam Vitals signs and nursing note reviewed.  Constitutional:      General: She is not in acute distress.    Appearance: She is well-developed. She is obese. She is not ill-appearing.  HENT:     Head: Normocephalic and atraumatic.  Eyes:     General: No scleral icterus.       Right eye: No discharge.        Left eye: No discharge.     Conjunctiva/sclera: Conjunctivae normal.     Pupils: Pupils are equal, round, and reactive to light.  Neck:     Musculoskeletal: Normal range of motion.  Cardiovascular:     Rate and Rhythm: Normal rate and regular rhythm.  Pulmonary:     Effort: Pulmonary effort is normal. No respiratory distress.     Breath sounds: Normal breath sounds.  Abdominal:     General: There is no distension.  Musculoskeletal:     Comments: Left leg: No obvious swelling, deformity, or warmth. No calf tenderness. FROM of knee and ankle. N/V intact.  Right leg: No obvious swelling, deformity, or warmth. There are some varicose veins that are not swollen or tender. Mild calf tenderness. N/V intact.    Skin:    General: Skin is warm and dry.  Neurological:     Mental Status: She is alert and oriented to person, place, and time.  Psychiatric:        Behavior: Behavior normal.      ED Treatments / Results  Labs (all  labs ordered  are listed, but only abnormal results are displayed) Labs Reviewed - No data to display  EKG None  Radiology No results found.  Procedures Procedures (including critical care time)  Medications Ordered in ED Medications - No data to display   Initial Impression / Assessment and Plan / ED Course  I have reviewed the triage vital signs and the nursing notes.  Pertinent labs & imaging results that were available during my care of the patient were reviewed by me and considered in my medical decision making (see chart for details).  58 year old female presents with right leg pain and swelling for the past 4 days.  She reports history of DVT.  Pain is somewhat similar but also happened after a severe cramp so could be musculoskeletal.  Her vital signs are normal.  She denies chest pain or shortness of breath.  Since it is after 7 PM we are unable to perform a vascular ultrasound tonight.  Recent labs reviewed and reveal normal hgb and kidney function in May of this year. She was given a 1mg /kg dose of Lovenox here and advised to come back in the morning for an outpatient US study.  Patient verbalized understanding.  Final Clinical Impressions(s) / ED Diagnoses   Final diagnoses:  Right leg pain    ED Discharge Orders    None       Bethel BornGekas, [place], PA-C 04/02/19 2057    Derwood KaplanNanavati, Ankit, MD 04/03/19 2156

## 2019-04-02 NOTE — Discharge Instructions (Addendum)
Please come to patient admitting tomorrow for your DVT study at 11AM

## 2019-04-02 NOTE — ED Triage Notes (Signed)
Pt endorses right lower leg pain x 5 days. Has hx of dvt and this feels the same. Pt sent for VAS [place] due to outpatient VAS [place] being closed. VSS. Axox4. Denies SHOB.

## 2019-04-03 ENCOUNTER — Ambulatory Visit (HOSPITAL_COMMUNITY)
Admission: RE | Admit: 2019-04-03 | Discharge: 2019-04-03 | Disposition: A | Discharge status: Home / Self Care | Payer: Managed Care, Other (non HMO) | Source: Ambulatory Visit | Attending: Medical | Admitting: Medical

## 2019-04-03 DIAGNOSIS — Z86718 Personal history of other venous thrombosis and embolism: Secondary | ICD-10-CM | POA: Diagnosis not present

## 2019-04-03 DIAGNOSIS — M79609 Pain in unspecified limb: Secondary | ICD-10-CM

## 2019-04-03 DIAGNOSIS — M79606 Pain in leg, unspecified: Secondary | ICD-10-CM | POA: Diagnosis present

## 2019-04-19 ENCOUNTER — Other Ambulatory Visit: Payer: Self-pay | Admitting: Physician Assistant

## 2019-04-19 DIAGNOSIS — Z1231 Encounter for screening mammogram for malignant neoplasm of breast: Secondary | ICD-10-CM

## 2019-06-04 ENCOUNTER — Ambulatory Visit
Admission: RE | Admit: 2019-06-04 | Discharge: 2019-06-04 | Disposition: A | Discharge status: Home / Self Care | Payer: Managed Care, Other (non HMO) | Source: Ambulatory Visit | Attending: Physician Assistant | Admitting: Physician Assistant

## 2019-06-04 ENCOUNTER — Other Ambulatory Visit: Payer: Self-pay

## 2019-06-04 DIAGNOSIS — Z1231 Encounter for screening mammogram for malignant neoplasm of breast: Secondary | ICD-10-CM

## 2019-06-05 ENCOUNTER — Other Ambulatory Visit: Payer: Self-pay | Admitting: Physician Assistant

## 2019-06-05 DIAGNOSIS — R928 Other abnormal and inconclusive findings on diagnostic imaging of breast: Secondary | ICD-10-CM

## 2019-06-11 ENCOUNTER — Ambulatory Visit
Admission: RE | Admit: 2019-06-11 | Discharge: 2019-06-11 | Disposition: A | Discharge status: Home / Self Care | Payer: Managed Care, Other (non HMO) | Source: Ambulatory Visit | Attending: Physician Assistant | Admitting: Physician Assistant

## 2019-06-11 ENCOUNTER — Other Ambulatory Visit: Payer: Self-pay

## 2019-06-11 ENCOUNTER — Other Ambulatory Visit: Payer: Self-pay | Admitting: Physician Assistant

## 2019-06-11 DIAGNOSIS — R928 Other abnormal and inconclusive findings on diagnostic imaging of breast: Secondary | ICD-10-CM

## 2019-06-11 DIAGNOSIS — N6320 Unspecified lump in the left breast, unspecified quadrant: Secondary | ICD-10-CM

## 2019-11-24 ENCOUNTER — Ambulatory Visit: Payer: Managed Care, Other (non HMO) | Attending: Internal Medicine

## 2019-11-24 DIAGNOSIS — Z23 Encounter for immunization: Secondary | ICD-10-CM

## 2019-11-24 NOTE — Progress Notes (Signed)
   Covid-19 Vaccination Clinic  Name:  Steffani Dionisio    MRN: [id number] DOB: Nov 07, 1960  11/24/2019  Ms. Fogleman was observed post Covid-19 immunization for 15 minutes without incident. She was provided with Vaccine Information Sheet and instruction to access the V-Safe system.   Ms. Real was instructed to call 911 with any severe reactions post vaccine: Marland Kitchen Difficulty breathing  . Swelling of face and throat  . A fast heartbeat  . A bad rash all over body  . Dizziness and weakness   Immunizations Administered    Name Date Dose VIS Date Route   Pfizer COVID-19 Vaccine 11/24/2019  2:51 PM 0.3 mL 08/10/2019 Intramuscular   Manufacturer: [employer], [employer]   Lot: [id number]   NDC: [address]-7

## 2019-12-18 ENCOUNTER — Ambulatory Visit: Payer: Managed Care, Other (non HMO) | Attending: Internal Medicine

## 2019-12-18 ENCOUNTER — Other Ambulatory Visit: Payer: Managed Care, Other (non HMO)

## 2019-12-18 DIAGNOSIS — Z23 Encounter for immunization: Secondary | ICD-10-CM

## 2019-12-18 NOTE — Progress Notes (Signed)
   Covid-19 Vaccination Clinic  Name:  [name]    MRN: [id number] DOB: 10-08-1960  12/18/2019  [name] was observed post Covid-19 immunization for 15 minutes without incident. She was provided with Vaccine Information Sheet and instruction to access the V-Safe system.   [name] was instructed to call 911 with any severe reactions post vaccine: Marland Kitchen Difficulty breathing  . Swelling of face and throat  . A fast heartbeat  . A bad rash all over body  . Dizziness and weakness   Immunizations Administered    Name Date Dose VIS Date Route   Pfizer COVID-19 Vaccine 12/18/2019  1:07 PM 0.3 mL 10/24/2018 Intramuscular   Manufacturer: [employer], [employer]   Lot: [id number]   NDC: [address]-9

## 2020-02-04 ENCOUNTER — Other Ambulatory Visit: Payer: Managed Care, Other (non HMO)

## 2020-02-18 ENCOUNTER — Other Ambulatory Visit: Payer: Self-pay | Admitting: Physician Assistant

## 2020-02-18 ENCOUNTER — Ambulatory Visit
Admission: RE | Admit: 2020-02-18 | Discharge: 2020-02-18 | Disposition: A | Discharge status: Home / Self Care | Payer: Managed Care, Other (non HMO) | Source: Ambulatory Visit | Attending: Physician Assistant | Admitting: Physician Assistant

## 2020-02-18 ENCOUNTER — Other Ambulatory Visit: Payer: Self-pay

## 2020-02-18 DIAGNOSIS — N6320 Unspecified lump in the left breast, unspecified quadrant: Secondary | ICD-10-CM

## 2020-03-12 DIAGNOSIS — K219 Gastro-esophageal reflux disease without esophagitis: Secondary | ICD-10-CM | POA: Insufficient documentation

## 2020-03-12 DIAGNOSIS — K5900 Constipation, unspecified: Secondary | ICD-10-CM | POA: Insufficient documentation

## 2020-04-29 ENCOUNTER — Ambulatory Visit: Payer: Managed Care, Other (non HMO) | Attending: Internal Medicine

## 2020-04-29 DIAGNOSIS — Z23 Encounter for immunization: Secondary | ICD-10-CM

## 2020-04-29 NOTE — Progress Notes (Signed)
° °  Covid-19 Vaccination Clinic  Name:  Katherine Adams    MRN: [id number] DOB: [birth year]  04/29/2020  Katherine Adams was observed post Covid-19 immunization for 15 minutes without incident. She was provided with Vaccine Information Sheet and instruction to access the V-Safe system.   Katherine Adams was instructed to call 911 with any severe reactions post vaccine:  Difficulty breathing   Swelling of face and throat   A fast heartbeat   A bad rash all over body   Dizziness and weakness

## 2020-05-06 DIAGNOSIS — M545 Low back pain, unspecified: Secondary | ICD-10-CM | POA: Insufficient documentation

## 2020-05-06 MED FILL — PFIZER-BIONTECH COVID-19 VA: 30 | 1 days supply | Qty: 0 | Fill #0

## 2020-06-03 ENCOUNTER — Other Ambulatory Visit: Payer: Self-pay | Admitting: Obstetrics and Gynecology

## 2020-06-03 DIAGNOSIS — N6320 Unspecified lump in the left breast, unspecified quadrant: Secondary | ICD-10-CM

## 2020-06-04 ENCOUNTER — Ambulatory Visit
Admission: RE | Admit: 2020-06-04 | Discharge: 2020-06-04 | Disposition: A | Discharge status: Home / Self Care | Payer: Managed Care, Other (non HMO) | Source: Ambulatory Visit | Attending: Physician Assistant | Admitting: Physician Assistant

## 2020-06-04 ENCOUNTER — Other Ambulatory Visit: Payer: Self-pay

## 2020-06-04 DIAGNOSIS — N6320 Unspecified lump in the left breast, unspecified quadrant: Secondary | ICD-10-CM

## 2021-02-17 DIAGNOSIS — M545 Low back pain, unspecified: Secondary | ICD-10-CM | POA: Insufficient documentation

## 2021-03-03 ENCOUNTER — Other Ambulatory Visit: Payer: Self-pay

## 2021-03-03 ENCOUNTER — Encounter (INDEPENDENT_AMBULATORY_CARE_PROVIDER_SITE_OTHER): Payer: Self-pay | Admitting: Family Medicine

## 2021-03-03 ENCOUNTER — Ambulatory Visit (INDEPENDENT_AMBULATORY_CARE_PROVIDER_SITE_OTHER): Payer: Managed Care, Other (non HMO) | Admitting: Family Medicine

## 2021-03-03 VITALS — BP 114/75 | HR 55 | Temp 98.5°F | Ht 67.0 in | Wt 218.0 lb

## 2021-03-03 DIAGNOSIS — Z86718 Personal history of other venous thrombosis and embolism: Secondary | ICD-10-CM

## 2021-03-03 DIAGNOSIS — F39 Unspecified mood [affective] disorder: Secondary | ICD-10-CM

## 2021-03-03 DIAGNOSIS — Z0289 Encounter for other administrative examinations: Secondary | ICD-10-CM

## 2021-03-03 DIAGNOSIS — R5383 Other fatigue: Secondary | ICD-10-CM

## 2021-03-03 DIAGNOSIS — R7303 Prediabetes: Secondary | ICD-10-CM | POA: Diagnosis not present

## 2021-03-03 DIAGNOSIS — Z1331 Encounter for screening for depression: Secondary | ICD-10-CM | POA: Diagnosis not present

## 2021-03-03 DIAGNOSIS — Z6834 Body mass index (BMI) 34.0-34.9, adult: Secondary | ICD-10-CM

## 2021-03-03 DIAGNOSIS — E559 Vitamin D deficiency, unspecified: Secondary | ICD-10-CM | POA: Diagnosis not present

## 2021-03-03 DIAGNOSIS — Z9189 Other specified personal risk factors, not elsewhere classified: Secondary | ICD-10-CM | POA: Diagnosis not present

## 2021-03-03 DIAGNOSIS — E785 Hyperlipidemia, unspecified: Secondary | ICD-10-CM

## 2021-03-03 DIAGNOSIS — E669 Obesity, unspecified: Secondary | ICD-10-CM

## 2021-03-03 DIAGNOSIS — R0602 Shortness of breath: Secondary | ICD-10-CM

## 2021-03-04 LAB — CBC WITH DIFFERENTIAL/PLATELET
Basophils Absolute: 0.1 10*3/uL (ref 0.0–0.2)
Basos: 1 %
EOS (ABSOLUTE): 0.3 10*3/uL (ref 0.0–0.4)
Eos: 3 %
Hematocrit: 46.2 % (ref 34.0–46.6)
Hemoglobin: 14.7 g/dL (ref 11.1–15.9)
Immature Grans (Abs): 0 10*3/uL (ref 0.0–0.1)
Immature Granulocytes: 0 %
Lymphocytes Absolute: 3.6 10*3/uL — ABNORMAL HIGH (ref 0.7–3.1)
Lymphs: 39 %
MCH: 28.7 pg (ref 26.6–33.0)
MCHC: 31.8 g/dL (ref 31.5–35.7)
MCV: 90 fL (ref 79–97)
Monocytes Absolute: 0.5 10*3/uL (ref 0.1–0.9)
Monocytes: 6 %
Neutrophils Absolute: 4.7 10*3/uL (ref 1.4–7.0)
Neutrophils: 51 %
Platelets: 285 10*3/uL (ref 150–450)
RBC: 5.12 x10E6/uL (ref 3.77–5.28)
RDW: 14.1 % (ref 11.7–15.4)
WBC: 9.2 10*3/uL (ref 3.4–10.8)

## 2021-03-04 LAB — COMPREHENSIVE METABOLIC PANEL
ALT: 19 IU/L (ref 0–32)
AST: 18 IU/L (ref 0–40)
Albumin/Globulin Ratio: 1.6 (ref 1.2–2.2)
Albumin: 4.8 g/dL (ref 3.8–4.9)
Alkaline Phosphatase: 86 IU/L (ref 44–121)
BUN/Creatinine Ratio: 27 — ABNORMAL HIGH (ref 9–23)
BUN: 23 mg/dL (ref 6–24)
Bilirubin Total: 0.4 mg/dL (ref 0.0–1.2)
CO2: 27 mmol/L (ref 20–29)
Calcium: 9.8 mg/dL (ref 8.7–10.2)
Chloride: 101 mmol/L (ref 96–106)
Creatinine, Ser: 0.84 mg/dL (ref 0.57–1.00)
Globulin, Total: 3 g/dL (ref 1.5–4.5)
Glucose: 72 mg/dL (ref 65–99)
Potassium: 4.4 mmol/L (ref 3.5–5.2)
Sodium: 142 mmol/L (ref 134–144)
Total Protein: 7.8 g/dL (ref 6.0–8.5)
eGFR: 80 mL/min/{1.73_m2} (ref >59)

## 2021-03-04 LAB — FOLATE: Folate: 16.4 ng/mL (ref >3.0)

## 2021-03-04 LAB — LIPID PANEL WITH LDL/HDL RATIO
Cholesterol, Total: 225 mg/dL — ABNORMAL HIGH (ref 100–199)
HDL: 72 mg/dL (ref >39)
LDL Chol Calc (NIH): 136 mg/dL — ABNORMAL HIGH (ref 0–99)
LDL/HDL Ratio: 1.9 ratio (ref 0.0–3.2)
Triglycerides: 95 mg/dL (ref 0–149)
VLDL Cholesterol Cal: 17 mg/dL (ref 5–40)

## 2021-03-04 LAB — HEMOGLOBIN A1C
Est. average glucose Bld gHb Est-mCnc: 128 mg/dL
Hgb A1c MFr Bld: 6.1 % — ABNORMAL HIGH (ref 4.8–5.6)

## 2021-03-04 LAB — T4, FREE: Free T4: 1.46 ng/dL (ref 0.82–1.77)

## 2021-03-04 LAB — INSULIN, RANDOM: INSULIN: 8.5 u[IU]/mL (ref 2.6–24.9)

## 2021-03-04 LAB — TSH: TSH: 1.63 u[IU]/mL (ref 0.450–4.500)

## 2021-03-04 LAB — VITAMIN B12: Vitamin B-12: 807 pg/mL (ref 232–1245)

## 2021-03-04 LAB — VITAMIN D 25 HYDROXY (VIT D DEFICIENCY, FRACTURES): Vit D, 25-Hydroxy: 65.6 ng/mL (ref 30.0–100.0)

## 2021-03-12 NOTE — Progress Notes (Signed)
Chief Complaint:   OBESITY Katherine Adams (MR# [id number]) is a 60 y.o. female who presents for evaluation and treatment of obesity and related comorbidities. Current BMI is Body mass index is 34.14 kg/m. Katherine Adams has been struggling with her weight for many years and has been unsuccessful in either losing weight, maintaining weight loss, or reaching her healthy weight goal.  Katherine Adams is currently in the action stage of change and ready to dedicate time achieving and maintaining a healthier weight. Katherine Adams is interested in becoming our patient and working on intensive lifestyle modifications including (but not limited to) diet and exercise for weight loss.  Katherine Adams lives with her spouse, Katherine NeedleMichael (also my patient).  She works full time as a Chief Executive Officerfront desk administrative assistant.  No exercise.  Eats out 3-4 times per week.  Craves chocolate/sweets and salty/crunchy foods.  Snacks on nuts, peanut butter, cookies, popcorn, etc.  Worst habit is fast food and bakery items.  [facility].  No labs for approximately 1 year.  Last appointment with PCP Avelino Leeds([place], [place], with [facility]) was in 02/2020.  GYN is Dr. Oscar LaJertson.  Katherine Adams habits were reviewed today and are as follows: Her family eats meals together, she thinks her family will eat healthier with her, her desired weight loss is 56 pounds, she started gaining weight in her late 440s, her heaviest weight ever was 230 pounds, she craves chocolate, sweets, crunchy foods, and occasionally salty snacks, she is frequently drinking liquids with calories, she frequently makes poor food choices, and she struggles with emotional eating.  Depression Screen Katherine Adams Food and Mood (modified PHQ-9) score was 14.  Depression screen PHQ 2/9 03/03/2021  Decreased Interest 2  Down, Depressed, Hopeless 3  PHQ - 2 Score 5  Altered sleeping 2  Tired, decreased energy 2  Change in appetite 1  Feeling bad or failure about yourself  1  Trouble concentrating 2  Moving slowly or  fidgety/restless 1  Suicidal thoughts 0  PHQ-9 Score 14  Difficult doing work/chores Somewhat difficult   Assessment/Plan:   Orders Placed This Encounter  Procedures   Vitamin B12   CBC with Differential/Platelet   Lipid Panel With LDL/HDL Ratio   T4, free   TSH   VITAMIN D 25 Hydroxy (Vit-D Deficiency, Fractures)   Folate   Hemoglobin A1c   Comprehensive metabolic panel   Insulin, random   EKG 12-Lead   1. Other fatigue Katherine Adams admits to daytime somnolence and reports waking up still tired. Patent has a history of symptoms of daytime fatigue, morning fatigue, morning headache, and snoring. Karlie generally gets 6 or 7 hours of sleep per night, and states that she has poor quality sleep. Snoring is present. Apneic episodes are not present. Epworth Sleepiness Score is 13.  Negative for OSA in recent past.  Chronically fatigued.  Seen by PCP in 2019 and diagnosed with fatigue.  Katherine Adams does feel that her weight is causing her energy to be lower than it should be. Fatigue may be related to obesity, depression or many other causes. Labs will be ordered, and in the meanwhile, Katherine Adams will focus on self care including making healthy food choices, increasing physical activity and focusing on stress reduction.  Check labs and EKG today.  - EKG 12-Lead - Vitamin B12 - CBC with Differential/Platelet - Lipid Panel With LDL/HDL Ratio - T4, free - TSH - Folate  2. Shortness of breath Katherine Adams notes increasing shortness of breath with exercising and seems to be worsening  over time with weight gain. She notes getting out of breath sooner with activity than she used to. This has gotten worse recently. Katherine Adams denies shortness of breath at rest or orthopnea.  Katherine Adams does feel that she gets out of breath more easily that she used to when she exercises. Katherine Adams shortness of breath appears to be obesity related and exercise induced. She has agreed to work on weight loss and gradually increase exercise to treat her exercise  induced shortness of breath. Will continue to monitor closely.  Check IC today.  3. Prediabetes Not at goal. Goal is HgbA1c < 5.7.  Medication: None.  She thinks she was diagnosed in 12/2018.  Plan:  She will continue to focus on protein-rich, low simple carbohydrate foods. We reviewed the importance of hydration, regular exercise for stress reduction, and restorative sleep.  Check A1c, CMP, and insulin level today.  Lab Results  Component Value Date   HGBA1C 6.1 (H) 03/03/2021   Lab Results  Component Value Date   INSULIN 8.5 03/03/2021   - Hemoglobin A1c - Comprehensive metabolic panel - Insulin, random  4. Vitamin D deficiency Diagnosed several years ago.  She was on a prescription for several months.  Taking OTC vitamin D 4,000 IU daily.   Plan:  Will check vitamin D level today, as per below.  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  No longer with issues and no treatment currently.  Likely due to inactivity.  Plan:  No active issue.  Will check labs today.  - Vitamin B12 - CBC with Differential/Platelet - T4, free - TSH - VITAMIN D 25 Hydroxy (Vit-D Deficiency, Fractures) - Folate - Comprehensive metabolic panel  6. Hyperlipidemia, unspecified hyperlipidemia type LDL 130 ~11 months ago, but HDL 64.  No medications in the past.  No exercise.  Plan:  Check labs, follow prudent nutritional plan.  Monitoring closely.  Dietary changes: Increase soluble fiber, decrease simple carbohydrates, decrease saturated fat.  Eventual Exercise changes: Moderate to vigorous-intensity aerobic activity 150 minutes per week or as tolerated. We will continue to monitor along with PCP/specialists as it pertains to her weight loss journey.   CHOLHDL 3.0 01/11/2018   The 10-year  ASCVD risk score Denman George DC Montez Hageman., et al., 2013) is: 2.2%   Values used to calculate the score:     Age: 41 years     Sex: Female     Is Non-Hispanic African American: No     Diabetic: No     Tobacco smoker: No     Systolic Blood Pressure: 114 mmHg     Is BP treated: No     HDL Cholesterol: 72 mg/dL     Total Cholesterol: 225 mg/dL  - Lipid Panel With LDL/HDL Ratio - Comprehensive metabolic panel  7. Mood disorder, with emotional eating Not at goal. Medication: Prozac 10 mg daily for 10+ years.  With depression and anxiety.  PHQ-9 is 14.  Plan:  Declines Dr. Dewaine Conger referral today.  Has concerns about ADHD/ADD and I advised her to speak with her PCP regarding referral for evaluation.  Also gave information for [facility].  Continue medications per PCP and will follow closely. Behavior modification techniques were discussed today to help deal with emotional/non-hunger eating behaviors.  8. Depression  screening Damonie was screened for depression as part of her new patient workup today.  PHQ-9 is 14.  Katherine Adams had a positive depression screening. Depression is commonly associated with obesity and often results in emotional eating behaviors. We will monitor this closely and work on CBT to help improve the non-hunger eating patterns. Referral to Psychology may be required if no improvement is seen as she continues in our clinic.  9. At risk for malnutrition Katherine Adams was given extensive malnutrition prevention education and counseling today of more than 23 minutes.  Counseled her that malnutrition refers to inappropriate nutrients or not the right balance of nutrients for optimal health.  Discussed with Katherine Adams that it is absolutely possible to be malnourished but yet obese.  Risk factors, including but not limited to, inappropriate dietary choices, difficulty with obtaining food due to physical or financial limitations, and various physical and mental health conditions were reviewed with  Katherine Adams.   10. Class 1 obesity with serious comorbidity and body mass index (BMI) of 34.0 to 34.9 in adult, unspecified obesity type  Katherine Adams is currently in the action stage of change and her goal is to continue with weight loss efforts. I recommend Chinmayi begin the structured treatment plan as follows:  She has agreed to the Category 1 Plan.  Exercise goals:  As is.    Behavioral modification strategies: meal planning and cooking strategies, better snacking choices, and planning for success.  She was informed of the importance of frequent follow-up visits to maximize her success with intensive lifestyle modifications for her multiple health conditions. She was informed we would discuss her lab results at her next visit unless there is a critical issue that needs to be addressed sooner. Katherine Adams agreed to keep her next visit at the agreed upon time to discuss these results.  Objective:   Blood pressure 114/75, pulse (!) 55, temperature 98.5 F (36.9 C), height 5\' 7"  (1.702 m), weight 218 lb (98.9 kg), last menstrual period 12/21/2013, SpO2 96 %. Body mass index is 34.14 kg/m.  EKG: Normal sinus rhythm, rate 52 bpm.  Indirect Calorimeter completed today shows a VO2 of 179 and a REE of 1243.  Her calculated basal metabolic rate is 12/23/2013 thus her basal metabolic rate is worse than expected.  General: Cooperative, alert, well developed, in no acute distress. HEENT: Conjunctivae and lids unremarkable. Cardiovascular: Regular rhythm.  Lungs: Normal work of breathing. Neurologic: No focal deficits.   Lab Results  Component Value Date   CREATININE 0.84 03/03/2021   BUN 23 03/03/2021   NA 142 03/03/2021   K 4.4 03/03/2021   CL 101 03/03/2021   CO2 27 03/03/2021   Lab Results  Component Value Date   ALT 19 03/03/2021   AST 18 03/03/2021   ALKPHOS 86 03/03/2021   BILITOT 0.4 03/03/2021   Lab Results  Component Value Date   HGBA1C 6.1 (H) 03/03/2021   Lab Results  Component Value Date    INSULIN 8.5 03/03/2021   Lab Results  Component Value Date   TSH 1.630 03/03/2021   Lab Results  Component Value Date   CHOL 225 (H) 03/03/2021   HDL 72 03/03/2021   LDLCALC 136 (H) 03/03/2021   TRIG 95 03/03/2021   CHOLHDL 3.0 01/11/2018   Lab Results  Component Value Date   VD25OH 65.6 03/03/2021   VD25OH 53.3 02/23/2017   VD25OH 53 01/13/2016   Lab Results  Component Value Date   WBC 9.2 03/03/2021   HGB 14.7  03/03/2021   HCT 46.2 03/03/2021   MCV 90 03/03/2021   PLT 285 03/03/2021   Attestation Statements:   This is the patient's first visit at [facility]. The patient's NEW PATIENT PACKET was reviewed at length. Included in the packet: current and past health history, medications, allergies, ROS, gynecologic history (women only), surgical history, family history, social history, weight history, weight loss surgery history (for those that have had weight loss surgery), nutritional evaluation, mood and food questionnaire, PHQ9, Epworth questionnaire, sleep habits questionnaire, patient life and health improvement goals questionnaire. These will all be scanned into the patient's chart under media.   During the visit, I independently reviewed the patient's EKG, bioimpedance scale results, and indirect calorimeter results. I used this information to tailor a meal plan for the patient that will help her to lose weight and will improve her obesity-related conditions going forward. I performed a medically necessary appropriate examination and/or evaluation. I discussed the assessment and treatment plan with the patient. The patient was provided an opportunity to ask questions and all were answered. The patient agreed with the plan and demonstrated an understanding of the instructions. Labs were ordered at this visit and will be reviewed at the next visit unless more critical results need to be addressed immediately. Clinical information was updated and documented in  the EMR.   I, Insurance claims handler, CMA, am acting as Energy manager for [employer], DO.  I have reviewed the above documentation for accuracy and completeness, and I agree with the above. Carlye Grippe, D.O.  The 21st Century Cures Act was signed into law in 2016 which includes the topic of electronic health records.  This provides immediate access to information in MyChart.  This includes consultation notes, operative notes, office notes, lab results and pathology reports.  If you have any questions about what you read please let us know at your next visit so we can discuss your concerns and take corrective action if need be.  We are right here with you.

## 2021-03-17 ENCOUNTER — Ambulatory Visit (INDEPENDENT_AMBULATORY_CARE_PROVIDER_SITE_OTHER): Payer: Managed Care, Other (non HMO) | Admitting: Family Medicine

## 2021-03-17 ENCOUNTER — Other Ambulatory Visit: Payer: Self-pay

## 2021-03-17 ENCOUNTER — Encounter (INDEPENDENT_AMBULATORY_CARE_PROVIDER_SITE_OTHER): Payer: Self-pay | Admitting: Family Medicine

## 2021-03-17 VITALS — BP 106/67 | HR 64 | Temp 98.4°F | Ht 67.0 in | Wt 218.0 lb

## 2021-03-17 DIAGNOSIS — E860 Dehydration: Secondary | ICD-10-CM

## 2021-03-17 DIAGNOSIS — R7303 Prediabetes: Secondary | ICD-10-CM | POA: Diagnosis not present

## 2021-03-17 DIAGNOSIS — E7849 Other hyperlipidemia: Secondary | ICD-10-CM | POA: Diagnosis not present

## 2021-03-17 DIAGNOSIS — Z9189 Other specified personal risk factors, not elsewhere classified: Secondary | ICD-10-CM

## 2021-03-17 DIAGNOSIS — E669 Obesity, unspecified: Secondary | ICD-10-CM

## 2021-03-17 DIAGNOSIS — E559 Vitamin D deficiency, unspecified: Secondary | ICD-10-CM

## 2021-03-17 DIAGNOSIS — Z6834 Body mass index (BMI) 34.0-34.9, adult: Secondary | ICD-10-CM

## 2021-03-17 NOTE — Patient Instructions (Signed)
The 10-year ASCVD risk score Denman George DC Montez Hageman., et al., 2013) is: 1.9%   Values used to calculate the score:     Age: 60 years     Sex: Female     Is Non-Hispanic African American: No     Diabetic: No     Tobacco smoker: No     Systolic Blood Pressure: 106 mmHg     Is BP treated: No     HDL Cholesterol: 72 mg/dL     Total Cholesterol: 225 mg/dL

## 2021-03-26 NOTE — Progress Notes (Signed)
Chief Complaint:   OBESITY Katherine Adams is here to discuss her progress with her obesity treatment plan along with follow-up of her obesity related diagnoses.   Today's visit was #: 2 Starting weight: 218 lbs Starting date: 03/03/2021 Today's weight: 218 lbs Today's date: 03/17/2021 Weight change since last visit: 0 Total lbs lost to date: 0 Body mass index is 34.14 kg/m.   Interim History:  Katherine Adams is here today for her first follow-up office visit since starting the program with [place].  All blood work/ lab tests that were recently ordered by myself or an outside provider were reviewed with patient today per their request.   Extended time was spent counseling her on all new disease processes that were discovered or preexisting ones that are worsening.  she understands that many of these abnormalities will need to monitored regularly along with the current treatment plan of prudent dietary changes, in which we are making each and every office visit, to improve these health parameters.  Katherine Adams's food recall appears accurate except for missing 1 bread and cheese.  Hungry all day long, even after eating.  She feels satisfied after lunch, which is the best meal of her day.  No cravings though.  No issues with the plan otherwise.  Current Meal Plan: the Category 1 Plan for 90% of the time.  Current Exercise Plan: None.  Assessment/Plan:   Medications Discontinued During This Encounter  Medication Reason   aspirin EC 81 MG tablet Error   fluticasone (FLONASE) 50 MCG/ACT nasal spray Error   Multiple Vitamins-Minerals (MULTIVITAMIN PO) Error    1. Prediabetes Not at goal. Goal is HgbA1c < 5.7.  Medication: None.  A1c was 5.8 ~1 year ago and is now 6.1.  elevated fasting insulin as well.  No carb cravings since starting meal plan.  Increased hunger all day.  Plan:  Discussed labs with patient today.  She prefers to control with diet and weight loss as first line.  She declines medications today.   Extensive counseling done.  May need medications if hunger increased in future despite meal plan changes.  She will continue to focus on protein-rich, low simple carbohydrate foods. We reviewed the importance of hydration, regular exercise for stress reduction, and restorative sleep.   Lab Results  Component Value Date   HGBA1C 6.1 (H) 03/03/2021   Lab Results  Component Value Date   INSULIN 8.5 03/03/2021   2. Other hyperlipidemia Course: Not at goal. Lipid-lowering medications: None.  Elevated HDL with elevated LDL.  Plan:  Discussed labs with patient today.  No need for medications. Dietary changes: Increase soluble fiber, decrease simple carbohydrates, decrease saturated fat. Exercise changes: Moderate to vigorous-intensity aerobic activity 150 minutes per week or as tolerated. We will continue to monitor along with PCP/specialists as it pertains to her weight loss journey.  Lab Results  Component Value Date   CHOL 225 (H) 03/03/2021   HDL 72 03/03/2021   LDLCALC 136 (H) 03/03/2021   TRIG 95 03/03/2021   CHOLHDL 3.0 01/11/2018   Lab Results  Component Value Date   ALT 19 03/03/2021   AST 18 03/03/2021   ALKPHOS 86 03/03/2021   BILITOT 0.4 03/03/2021   The 10-year ASCVD risk score Denman George DC Jr., et al., 2013) is: 1.9%   Values used to calculate the score:     Age: 60 years     Sex: Female     Is Non-Hispanic African American: No     Diabetic:  No     Tobacco smoker: No     Systolic Blood Pressure: 106 mmHg     Is BP treated: No     HDL Cholesterol: 72 mg/dL     Total Cholesterol: 225 mg/dL  3. Mild dehydration Elevated BUN and high normal sodium level.  Drinks around 60 ounces per day.  Plan:  Discussed labs with patient today.  Drink 80-100 ounces of water per day.  4. Vitamin D deficiency At goal.  No issues or concerns with her OTC vitamin D.  Still with fatigue/feeling tired a lot.  Plan:  Discussed labs with patient today.  Continue current OTC vitamin D  supplementation.  Follow-up for routine testing of Vitamin D, at least 2-3 times per year to avoid over-replacement.    Lab Results  Component Value Date   VD25OH 65.6 03/03/2021   VD25OH 53.3 02/23/2017   VD25OH 53 01/13/2016   5. At risk for diabetes mellitus - [name] was given diabetes prevention education and counseling today of more than 23 minutes.  - Counseled patient on pathophysiology of disease and meaning/ implication of lab results.  - Reviewed how certain foods can either stimulate or inhibit insulin release, and subsequently affect hunger pathways  - Importance of following a healthy meal plan with limiting amounts of simple carbohydrates discussed with patient - Effects of regular aerobic exercise on blood sugar regulation reviewed and encouraged an eventual goal of 30 min 5d/week or more as a minimum.  - Briefly discussed treatment options, which always include dietary and lifestyle modification as first line.   - Handouts provided at patient's desire and/or told to go online to the American Diabetes Association website for further information.  6. Obeity with current BMI of 34.1  Course: Katherine Adams is currently in the action stage of change. As such, her goal is to continue with weight loss efforts.   Nutrition goals: She has agreed to the Category 1 Plan with breakfast options plus 6 ounces at lunch.   Exercise goals:  As is.  Behavioral modification strategies: increasing lean protein intake, decreasing simple carbohydrates, increasing water intake, better snacking choices, and planning for success.  [name] has agreed to follow-up with our clinic in 2 weeks. She was informed of the importance of frequent follow-up visits to maximize her success with intensive lifestyle modifications for her multiple health conditions.   Objective:   Blood pressure 106/67, pulse 64, temperature 98.4 F (36.9 C), height 5\' 7"  (1.702 m), weight 218 lb (98.9 kg), last menstrual period 12/21/2013,  SpO2 97 %. Body mass index is 34.14 kg/m.  General: Cooperative, alert, well developed, in no acute distress. HEENT: Conjunctivae and lids unremarkable. Cardiovascular: Regular rhythm.  Lungs: Normal work of breathing. Neurologic: No focal deficits.   Lab Results  Component Value Date   CREATININE 0.84 03/03/2021   BUN 23 03/03/2021   NA 142 03/03/2021   K 4.4 03/03/2021   CL 101 03/03/2021   CO2 27 03/03/2021   Lab Results  Component Value Date   ALT 19 03/03/2021   AST 18 03/03/2021   ALKPHOS 86 03/03/2021   BILITOT 0.4 03/03/2021   Lab Results  Component Value Date   HGBA1C 6.1 (H) 03/03/2021   Lab Results  Component Value Date   INSULIN 8.5 03/03/2021   Lab Results  Component Value Date   TSH 1.630 03/03/2021   Lab Results  Component Value Date   CHOL 225 (H) 03/03/2021   HDL 72 03/03/2021   LDLCALC  136 (H) 03/03/2021   TRIG 95 03/03/2021   CHOLHDL 3.0 01/11/2018   Lab Results  Component Value Date   VD25OH 65.6 03/03/2021   VD25OH 53.3 02/23/2017   VD25OH 53 01/13/2016   Lab Results  Component Value Date   WBC 9.2 03/03/2021   HGB 14.7 03/03/2021   HCT 46.2 03/03/2021   MCV 90 03/03/2021   PLT 285 03/03/2021   Attestation Statements:   Reviewed by clinician on day of visit: allergies, medications, problem list, medical history, surgical history, family history, social history, and previous encounter notes.  I, Insurance claims handler, CMA, am acting as Energy manager for [employer], DO.  I have reviewed the above documentation for accuracy and completeness, and I agree with the above. Carlye Grippe, D.O.  The 21st Century Cures Act was signed into law in 2016 which includes the topic of electronic health records.  This provides immediate access to information in MyChart.  This includes consultation notes, operative notes, office notes, lab results and pathology reports.  If you have any questions about what you read please let us know at your  next visit so we can discuss your concerns and take corrective action if need be.  We are right here with you.

## 2021-04-01 ENCOUNTER — Ambulatory Visit (INDEPENDENT_AMBULATORY_CARE_PROVIDER_SITE_OTHER): Payer: Managed Care, Other (non HMO) | Admitting: Family Medicine

## 2021-04-01 ENCOUNTER — Other Ambulatory Visit: Payer: Self-pay

## 2021-04-01 ENCOUNTER — Encounter (INDEPENDENT_AMBULATORY_CARE_PROVIDER_SITE_OTHER): Payer: Self-pay | Admitting: Family Medicine

## 2021-04-01 VITALS — BP 103/70 | HR 62 | Temp 98.1°F | Ht 67.0 in | Wt 214.0 lb

## 2021-04-01 DIAGNOSIS — R7303 Prediabetes: Secondary | ICD-10-CM | POA: Diagnosis not present

## 2021-04-01 DIAGNOSIS — K5909 Other constipation: Secondary | ICD-10-CM | POA: Diagnosis not present

## 2021-04-01 DIAGNOSIS — E669 Obesity, unspecified: Secondary | ICD-10-CM | POA: Diagnosis not present

## 2021-04-01 DIAGNOSIS — Z9189 Other specified personal risk factors, not elsewhere classified: Secondary | ICD-10-CM

## 2021-04-01 DIAGNOSIS — Z6834 Body mass index (BMI) 34.0-34.9, adult: Secondary | ICD-10-CM

## 2021-04-06 NOTE — Progress Notes (Signed)
Chief Complaint:   OBESITY Katherine Adams is here to discuss her progress with her obesity treatment plan along with follow-up of her obesity related diagnoses. Katherine Adams is on the Category 1 Plan with breakfast options and states she is following her eating plan approximately 95% of the time. Katherine Adams states she is doing 0 minutes 0 times per week.  Today's visit was #: 3 Starting weight: 218 lbs Starting date: 03/03/2021 Today's weight: 214 lbs Today's date: 04/01/2021 Total lbs lost to date: 4 Total lbs lost since last in-office visit: 4  Interim History: Katherine Adams is here for a follow up office visit. We reviewed her meal plan and questions were answered. Patient's food recall appears to be accurate and consistent with what is on plan when she is following it. When eating on plan, her hunger and cravings are well controlled. Katherine Adams is now eating the sara lee bread and is not skipping foods now. She is drinking more water as well.   Subjective:   1. Pre-diabetes Katherine Adams still notes sweets and carbohydrate cravings, but pretty well controlled with yogurt or fruit as snack calories.  2. Other constipation Katherine Adams notes her symptoms have improved with treatment.  3. At risk for activity intolerance Katherine Adams is at risk for exercise intolerance  Assessment/Plan:   1. Pre-diabetes Katherine Adams declines the need for medications at this time. She will continue to work on weight loss, exercise, and decreasing simple carbohydrates to help decrease the risk of diabetes.   2. Other constipation Katherine Adams symptoms improved with CALM supplementation we recommended at her last office visit. She has no concerns and will continue her supplementations and increase water intake and activity. She was informed that a decrease in bowel movement frequency is normal while losing weight, but stools should not be hard or painful. Orders and follow up as documented in patient record.   Counseling Getting to Good Bowel Health: Your goal is to have  one soft bowel movement each day. Drink at least 8 glasses of water each day. Eat plenty of fiber (goal is over 25 grams each day). It is best to get most of your fiber from dietary sources which includes leafy green vegetables, fresh fruit, and whole grains. You may need to add fiber with the help of OTC fiber supplements. These include Metamucil, Citrucel, and Flaxseed. If you are still having trouble, try adding Miralax or Magnesium Citrate. If all of these changes do not work, Dietitian.  3. At risk for activity intolerance Katherine Adams was given approximately 8- minutes of exercise intolerance counseling today. She is 60 y.o. female and has risk factors exercise intolerance including obesity. We discussed intensive lifestyle modifications today with an emphasis on specific weight loss instructions and strategies. Katherine Adams will slowly increase activity as tolerated.  Repetitive spaced learning was employed today to elicit superior memory formation and behavioral change.  4. Obesity with current BMI of 33.5 Katherine Adams is currently in the action stage of change. As such, her goal is to continue with weight loss efforts. She has agreed to the Category 1 Plan with breakfast options.   Exercise goals: Katherine Adams desires to start walking 3 days per week for 15-20 minutes.  Behavioral modification strategies: increasing water intake.  Katherine Adams has agreed to follow-up with our clinic in 2 weeks. She was informed of the importance of frequent follow-up visits to maximize her success with intensive lifestyle modifications for her multiple health conditions.   Objective:   Blood pressure 103/70, pulse  62, temperature 98.1 F (36.7 C), height 5\' 7"  (1.702 m), weight 214 lb (97.1 kg), last menstrual period 12/21/2013, SpO2 96 %. Body mass index is 33.52 kg/m.  General: Cooperative, alert, well developed, in no acute distress. HEENT: Conjunctivae and lids unremarkable. Cardiovascular: Regular rhythm.  Lungs: Normal work  of breathing. Neurologic: No focal deficits.   Lab Results  Component Value Date   CREATININE 0.84 03/03/2021   BUN 23 03/03/2021   NA 142 03/03/2021   K 4.4 03/03/2021   CL 101 03/03/2021   CO2 27 03/03/2021   Lab Results  Component Value Date   ALT 19 03/03/2021   AST 18 03/03/2021   ALKPHOS 86 03/03/2021   BILITOT 0.4 03/03/2021   Lab Results  Component Value Date   HGBA1C 6.1 (H) 03/03/2021   Lab Results  Component Value Date   INSULIN 8.5 03/03/2021   Lab Results  Component Value Date   TSH 1.630 03/03/2021   Lab Results  Component Value Date   CHOL 225 (H) 03/03/2021   HDL 72 03/03/2021   LDLCALC 136 (H) 03/03/2021   TRIG 95 03/03/2021   CHOLHDL 3.0 01/11/2018   Lab Results  Component Value Date   VD25OH 65.6 03/03/2021   VD25OH 53.3 02/23/2017   VD25OH 53 05/16/Katherine Adams   Lab Results  Component Value Date   WBC 9.2 03/03/2021   HGB 14.7 03/03/2021   HCT 46.2 03/03/2021   MCV 90 03/03/2021   PLT 285 03/03/2021   No results found for: IRON, TIBC, FERRITIN  Attestation Statements:   Reviewed by clinician on day of visit: allergies, medications, problem list, medical history, surgical history, family history, social history, and previous encounter notes.   05/04/2021, am acting as transcriptionist for Trude Mcburney, DO.  I have reviewed the above documentation for accuracy and completeness, and I agree with the above. [employer], D.O.  The 21st Century Cures Act was signed into law in 2016 which includes the topic of electronic health records.  This provides immediate access to information in MyChart.  This includes consultation notes, operative notes, office notes, lab results and pathology reports.  If you have any questions about what you read please let Katherine Adams know at your next visit so we can discuss your concerns and take corrective action if need be.  We are right here with you.

## 2021-04-15 ENCOUNTER — Other Ambulatory Visit: Payer: Self-pay

## 2021-04-15 ENCOUNTER — Encounter (INDEPENDENT_AMBULATORY_CARE_PROVIDER_SITE_OTHER): Payer: Self-pay | Admitting: Family Medicine

## 2021-04-15 ENCOUNTER — Ambulatory Visit (INDEPENDENT_AMBULATORY_CARE_PROVIDER_SITE_OTHER): Payer: Managed Care, Other (non HMO) | Admitting: Family Medicine

## 2021-04-15 VITALS — BP 114/75 | HR 61 | Temp 98.5°F | Ht 67.0 in | Wt 211.0 lb

## 2021-04-15 DIAGNOSIS — E669 Obesity, unspecified: Secondary | ICD-10-CM | POA: Diagnosis not present

## 2021-04-15 DIAGNOSIS — Z9189 Other specified personal risk factors, not elsewhere classified: Secondary | ICD-10-CM | POA: Diagnosis not present

## 2021-04-15 DIAGNOSIS — Z6834 Body mass index (BMI) 34.0-34.9, adult: Secondary | ICD-10-CM | POA: Diagnosis not present

## 2021-04-15 DIAGNOSIS — K5909 Other constipation: Secondary | ICD-10-CM

## 2021-04-16 NOTE — Progress Notes (Signed)
Chief Complaint:   OBESITY Katherine Adams is here to discuss her progress with her obesity treatment plan along with follow-up of her obesity related diagnoses. [name] is on the Category 1 Plan with breakfast options and states she is following her eating plan approximately 90% of the time. [name] states she is doing 0 minutes 0 times per week.  Today's visit was #: 4 Starting weight: 218 lbs Starting date: 03/03/2021 Today's weight: 211 lbs Today's date: 04/15/2021 Total lbs lost to date: 7 Total lbs lost since last in-office visit: 3  Interim History: Katherine Adams is here for a follow up office visit.  We reviewed her meal plan and questions were answered.  Patient's food recall appears to be accurate and consistent with what is on plan when she is following it.   When eating on plan, her hunger and cravings are well controlled.    Subjective:   1. Other constipation [name] is drinking 85 oz or so of water per day. She notes improved symptoms with using CALM supplementations along with magnesium supplement 500 mg tablets and stool softener that I recommended at her last office visit.   2. At risk for dehydration [name] is at risk for dehydration due to inadequate water intake.  Assessment/Plan:  No orders of the defined types were placed in this encounter.   There are no discontinued medications.   No orders of the defined types were placed in this encounter.    1. Other constipation Katherine Adams will continue magnesium supplementation, stool softener, etc. She denies the need for a refill or script. She is to continue adequate oral liquid intake and push for 100 oz per day.  Counseling Getting to Good Bowel Health: Your goal is to have one soft bowel movement each day. Drink at least 8 glasses of water each day. Eat plenty of fiber (goal is over 25 grams each day). It is best to get most of your fiber from dietary sources which includes leafy green vegetables, fresh fruit, and whole grains. You may need  to add fiber with the help of OTC fiber supplements. These include Metamucil, Citrucel, and Flaxseed. If you are still having trouble, try adding Miralax or Magnesium Citrate. If all of these changes do not work, Dietitian.  2. At risk for dehydration Katherine Adams is at increased risk for constipation due to inadequate water intake, changes in diet, and/or use of certain medications. Patient was provided with 9 minutes of counseling today regarding this condition and related ones.  We discussed preventative OTC therapies that exist such as MiraLAX, stool softeners, etc.   We also discussed the importance of adequate fiber intake and for patient to drink 1/2 weight in ounces of water per day, unless told otherwise by a Cardiologist, Nephrologist, or another medical provider.    3. Obesity with current BMI of 33.12 Katherine Adams is currently in the action stage of change. As such, her goal is to continue with weight loss efforts. She has agreed to the Category 1 Plan with breakfast options.   Exercise goals: Katherine Adams's goal is to walk 3 days per week for 10-15 minutes.  Behavioral modification strategies: increasing water intake and keeping healthy foods in the home.  Katherine Adams has agreed to follow-up with our clinic in 2 weeks. She was informed of the importance of frequent follow-up visits to maximize her success with intensive lifestyle modifications for her multiple health conditions.   Objective:   Blood pressure 114/75, pulse 61, temperature 98.5  F (36.9 C), height 5\' 7"  (1.702 m), weight 211 lb (95.7 kg), last menstrual period 12/21/2013, SpO2 92 %. Body mass index is 33.05 kg/m.  General: Cooperative, alert, well developed, in no acute distress. HEENT: Conjunctivae and lids unremarkable. Cardiovascular: Regular rhythm.  Lungs: Normal work of breathing. Neurologic: No focal deficits.   Lab Results  Component Value Date   CREATININE 0.84 03/03/2021   BUN 23 03/03/2021   NA 142 03/03/2021    K 4.4 03/03/2021   CL 101 03/03/2021   CO2 27 03/03/2021   Lab Results  Component Value Date   ALT 19 03/03/2021   AST 18 03/03/2021   ALKPHOS 86 03/03/2021   BILITOT 0.4 03/03/2021   Lab Results  Component Value Date   HGBA1C 6.1 (H) 03/03/2021   Lab Results  Component Value Date   INSULIN 8.5 03/03/2021   Lab Results  Component Value Date   TSH 1.630 03/03/2021   Lab Results  Component Value Date   CHOL 225 (H) 03/03/2021   HDL 72 03/03/2021   LDLCALC 136 (H) 03/03/2021   TRIG 95 03/03/2021   CHOLHDL 3.0 01/11/2018   Lab Results  Component Value Date   VD25OH 65.6 03/03/2021   VD25OH 53.3 02/23/2017   VD25OH 53 01/13/2016   Lab Results  Component Value Date   WBC 9.2 03/03/2021   HGB 14.7 03/03/2021   HCT 46.2 03/03/2021   MCV 90 03/03/2021   PLT 285 03/03/2021   No results found for: IRON, TIBC, FERRITIN  Attestation Statements:   Reviewed by clinician on day of visit: allergies, medications, problem list, medical history, surgical history, family history, social history, and previous encounter notes.   05/04/2021, am acting as transcriptionist for Trude Mcburney, DO.  I have reviewed the above documentation for accuracy and completeness, and I agree with the above. [employer], D.O.  The 21st Century Cures Act was signed into law in 2016 which includes the topic of electronic health records.  This provides immediate access to information in MyChart.  This includes consultation notes, operative notes, office notes, lab results and pathology reports.  If you have any questions about what you read please let 2017 know at your next visit so we can discuss your concerns and take corrective action if need be.  We are right here with you.

## 2021-04-28 ENCOUNTER — Encounter (INDEPENDENT_AMBULATORY_CARE_PROVIDER_SITE_OTHER): Payer: Self-pay

## 2021-04-29 ENCOUNTER — Other Ambulatory Visit: Payer: Self-pay

## 2021-04-29 ENCOUNTER — Ambulatory Visit (INDEPENDENT_AMBULATORY_CARE_PROVIDER_SITE_OTHER): Payer: Managed Care, Other (non HMO) | Admitting: Family Medicine

## 2021-04-29 ENCOUNTER — Encounter (INDEPENDENT_AMBULATORY_CARE_PROVIDER_SITE_OTHER): Payer: Self-pay | Admitting: Family Medicine

## 2021-04-29 VITALS — BP 113/72 | HR 65 | Temp 98.5°F | Ht 67.0 in | Wt 211.0 lb

## 2021-04-29 DIAGNOSIS — Z9189 Other specified personal risk factors, not elsewhere classified: Secondary | ICD-10-CM | POA: Diagnosis not present

## 2021-04-29 DIAGNOSIS — J3089 Other allergic rhinitis: Secondary | ICD-10-CM | POA: Diagnosis not present

## 2021-04-29 DIAGNOSIS — Z6834 Body mass index (BMI) 34.0-34.9, adult: Secondary | ICD-10-CM | POA: Diagnosis not present

## 2021-04-29 DIAGNOSIS — E669 Obesity, unspecified: Secondary | ICD-10-CM | POA: Diagnosis not present

## 2021-04-29 MED ORDER — LEVOCETIRIZINE DIHYDROCHLORIDE 5 MG PO TABS: 5.0000 mg | ORAL_TABLET | Freq: Every evening | ORAL | 0 refills | Status: DC

## 2021-04-29 MED ORDER — OLOPATADINE HCL 0.1 % OP SOLN: 1.0000 [drp] | Freq: Two times a day (BID) | OPHTHALMIC | 12 refills | Status: DC

## 2021-05-05 NOTE — Progress Notes (Signed)
Chief Complaint:   OBESITY Katherine Adams is here to discuss her progress with her obesity treatment plan along with follow-up of her obesity related diagnoses. Katherine Adams is on the Category 1 Plan with breakfast options and states she is following her eating plan approximately 95-100% of the time. Katherine Adams states she is walking 20 minutes 1-2 times per week.  Today's visit was #: 5 Starting weight: 218 lbs Starting date: 03/03/2021 Today's weight: 211 lbs Today's date: 04/29/2021 Total lbs lost to date: 7 Total lbs lost since last in-office visit: 0  Interim History: Katherine Adams reports the plan is easy. She is eating all foods and likes breakfast.  Subjective:   1. Environmental and seasonal allergies Katherine Adams reports increased symptoms lately.  2. At risk for impaired metabolic function Katherine Adams is at increased risk for impaired metabolic function due to current nutrition and muscle mass.  Assessment/Plan:  No orders of the defined types were placed in this encounter.   There are no discontinued medications.   Meds ordered this encounter  Medications   levocetirizine (XYZAL) 5 MG tablet    Sig: Take 1 tablet (5 mg total) by mouth every evening.    Dispense:  30 tablet    Refill:  0   olopatadine (PATANOL) 0.1 % ophthalmic solution    Sig: Place 1 drop into both eyes 2 (two) times daily.    Dispense:  5 mL    Refill:  12     1. Environmental and seasonal allergies Start Pataday and Xyzal, as prescribed below. Discontinue Zyrtec.  Start- levocetirizine (XYZAL) 5 MG tablet; Take 1 tablet (5 mg total) by mouth every evening.  Dispense: 30 tablet; Refill: 0 Start- olopatadine (PATANOL) 0.1 % ophthalmic solution; Place 1 drop into both eyes 2 (two) times daily.  Dispense: 5 mL; Refill: 12  2. At risk for impaired metabolic function Due to Katherine Adams's current state of health and medical condition(s), she is at a significantly higher risk for impaired metabolic function.   At least 9 minutes was spent on counseling  Katherine Adams about these concerns today.  This places the patient at a much greater risk to subsequently develop cardio-pulmonary conditions that can negatively affect the patient's quality of life.  I stressed the importance of reversing these risks factors.  The initial goal is to lose at least 5-10% of starting weight to help reduce risk factors.  Counseling:  Intensive lifestyle modifications discussed with Katherine Adams as the most appropriate first line treatment.  she will continue to work on diet, exercise, and weight loss efforts.  We will continue to reassess these conditions on a fairly regular basis in an attempt to decrease the patient's overall morbidity and mortality.  3. Obesity with current BMI of 33.0  Katherine Adams is currently in the action stage of change. As such, her goal is to continue with weight loss efforts. She has agreed to the Category 1 Plan with 6 oz protein at lunch and breakfast options and keeping a food journal and adhering to recommended goals of 1000-1100 calories and 80+ grams protein.   Exercise goals:  Walk 15-20 minutes 3-4 days a week.  Behavioral modification strategies: keeping a strict food journal.  Katherine Adams has agreed to follow-up with our clinic in 3 weeks. She was informed of the importance of frequent follow-up visits to maximize her success with intensive lifestyle modifications for her multiple health conditions.   Objective:   Blood pressure 113/72, pulse 65, temperature 98.5 F (36.9 C), height 5\' 7"  (  1.702 m), weight 211 lb (95.7 kg), last menstrual period 12/21/2013, SpO2 94 %. Body mass index is 33.05 kg/m.  General: Cooperative, alert, well developed, in no acute distress. HEENT: Conjunctivae and lids unremarkable. Cardiovascular: Regular rhythm.  Lungs: Normal work of breathing. Neurologic: No focal deficits.   Lab Results  Component Value Date   CREATININE 0.84 03/03/2021   BUN 23 03/03/2021   NA 142 03/03/2021   K 4.4 03/03/2021   CL 101 03/03/2021   CO2 27  03/03/2021   Lab Results  Component Value Date   ALT 19 03/03/2021   AST 18 03/03/2021   ALKPHOS 86 03/03/2021   BILITOT 0.4 03/03/2021   Lab Results  Component Value Date   HGBA1C 6.1 (H) 03/03/2021   Lab Results  Component Value Date   INSULIN 8.5 03/03/2021   Lab Results  Component Value Date   TSH 1.630 03/03/2021   Lab Results  Component Value Date   CHOL 225 (H) 03/03/2021   HDL 72 03/03/2021   LDLCALC 136 (H) 03/03/2021   TRIG 95 03/03/2021   CHOLHDL 3.0 01/11/2018   Lab Results  Component Value Date   VD25OH 65.6 03/03/2021   VD25OH 53.3 02/23/2017   VD25OH 53 01/13/2016   Lab Results  Component Value Date   WBC 9.2 03/03/2021   HGB 14.7 03/03/2021   HCT 46.2 03/03/2021   MCV 90 03/03/2021   PLT 285 03/03/2021    Attestation Statements:   Reviewed by clinician on day of visit: allergies, medications, problem list, medical history, surgical history, family history, social history, and previous encounter notes.  Edmund Hilda, CMA, am acting as transcriptionist for [employer], DO.  I have reviewed the above documentation for accuracy and completeness, and I agree with the above. Carlye Grippe, D.O.  The 21st Century Cures Act was signed into law in 2016 which includes the topic of electronic health records.  This provides immediate access to information in MyChart.  This includes consultation notes, operative notes, office notes, lab results and pathology reports.  If you have any questions about what you read please let us know at your next visit so we can discuss your concerns and take corrective action if need be.  We are right here with you.

## 2021-05-13 ENCOUNTER — Ambulatory Visit (INDEPENDENT_AMBULATORY_CARE_PROVIDER_SITE_OTHER): Payer: Managed Care, Other (non HMO) | Admitting: Family Medicine

## 2021-05-13 ENCOUNTER — Encounter (INDEPENDENT_AMBULATORY_CARE_PROVIDER_SITE_OTHER): Payer: Self-pay | Admitting: Family Medicine

## 2021-05-13 ENCOUNTER — Other Ambulatory Visit: Payer: Self-pay

## 2021-05-13 VITALS — BP 124/80 | HR 55 | Temp 97.5°F | Ht 67.0 in | Wt 206.0 lb

## 2021-05-13 DIAGNOSIS — Z9189 Other specified personal risk factors, not elsewhere classified: Secondary | ICD-10-CM

## 2021-05-13 DIAGNOSIS — E669 Obesity, unspecified: Secondary | ICD-10-CM | POA: Diagnosis not present

## 2021-05-13 DIAGNOSIS — Z6834 Body mass index (BMI) 34.0-34.9, adult: Secondary | ICD-10-CM

## 2021-05-13 DIAGNOSIS — J3089 Other allergic rhinitis: Secondary | ICD-10-CM

## 2021-05-13 LAB — COLOGUARD: COLOGUARD: NEGATIVE

## 2021-05-13 MED ORDER — LEVOCETIRIZINE DIHYDROCHLORIDE 5 MG PO TABS: 5.0000 mg | ORAL_TABLET | Freq: Every evening | ORAL | 0 refills | Status: DC

## 2021-05-14 NOTE — Progress Notes (Signed)
Chief Complaint:   OBESITY Katherine Adams Adams here to discuss her progress with her obesity treatment plan along with follow-up of her obesity related diagnoses. Katherine Adams Adams on the Category 1 Plan with 6 oz of protein at lunch and dinner or keeping a food journal and adhering to recommended goals of 1000-1100 calories and 80+ grams of protein daily and states she Adams following her eating plan approximately 95-100% of the time. Katherine Adams states she Adams doing 0 minutes 0 times per week.  Today's visit was #: 6 Starting weight: 218 lbs Starting date: 03/03/2021 Today's weight: 206 lbs Today's date: 05/13/2021 Total lbs lost to date: 12 Total lbs lost since last in-office visit: 5  Interim History: Katherine Adams forgot to bring her log, but she notes almost everyday she hits her calories and protein goals. She notes one day she didn't log in her food. She Adams drinking 64 oz of water per day.  Subjective:   1. Environmental and seasonal allergies Katherine Adams has seasonal allergies that have been more bothersome with the change of weather/season. She Adams taking Xyzal, and she notes her symptoms are controlled.  2. At risk for constipation Katherine Adams at increased risk for constipation due to inadequate water intake.   Assessment/Plan:  No orders of the defined types were placed in this encounter.   Medications Discontinued During This Encounter  Medication Reason   Cetirizine HCl 10 MG CAPS    levocetirizine (XYZAL) 5 MG tablet Reorder     Meds ordered this encounter  Medications   levocetirizine (XYZAL) 5 MG tablet    Sig: Take 1 tablet (5 mg total) by mouth every evening.    Dispense:  30 tablet    Refill:  0     1. Environmental and seasonal allergies Katherine Adams will continue Xyzal and we will refill for 1 month.  - Seasonal and environmental allergies and pathophysiology of disease process discussed with patient.  - Preventative strategies as first line for management discussed.  I encouraged use of KN or N-95 mask  use as well as sterile saline rinses such as Katherine Adams Med or AYR sinus rinses to be done twice daily and after any prolonged exposure to the environment or allergen. It Adams best to use distilled water or previously boiled water    - If eyes are itchy or irritated feeling when your seasonal allergies get bad, ok to use Naphcon-A over-the-counter eyedrops as needed - If continues to worsen despite preventative strategies, take over-the-counter meds such as Allegra, Xyzal etc daily during allergy seasons or nasal steroids such as Flonase, Rhinocort and the like - We can consider Astelin nasal spray, if symptoms are not well controlled with OTC medications, nasal rinses and preventative strategies. - Encouraged to return to clinic or call the office today discussed further questions or concerns they may have.  - levocetirizine (XYZAL) 5 MG tablet; Take 1 tablet (5 mg total) by mouth every evening.  Dispense: 30 tablet; Refill: 0  2. At risk for constipation Katherine Adams was given approximately 15 minutes of counseling today regarding prevention of constipation. She was encouraged to increase water and fiber intake.   3. Obesity with current BMI of 32.4 Katherine Adams Adams currently in the action stage of change. As such, her goal Adams to continue with weight loss efforts. She has agreed to the Category 1 Plan.   Katherine Adams to bring in journaling log and track her intake as well.  Exercise goals: As Adams.  Behavioral modification  strategies: increasing lean protein intake, decreasing simple carbohydrates, and planning for success.  Katherine Adams has agreed to follow-up with our clinic in 2 to 3 weeks. She was informed of the importance of frequent follow-up visits to maximize her success with intensive lifestyle modifications for her multiple health conditions.   Objective:   Blood pressure 124/80, pulse (!) 55, temperature (!) 97.5 F (36.4 C), height 5\' 7"  (1.702 m), weight 206 lb (93.4 kg), last menstrual period 12/21/2013, SpO2 98  %. Body mass index Adams 32.26 kg/m.  General: Cooperative, alert, well developed, in no acute distress. HEENT: Conjunctivae and lids unremarkable. Cardiovascular: Regular rhythm.  Lungs: Normal work of breathing. Neurologic: No focal deficits.   Lab Results  Component Value Date   CREATININE 0.84 03/03/2021   BUN 23 03/03/2021   NA 142 03/03/2021   K 4.4 03/03/2021   CL 101 03/03/2021   CO2 27 03/03/2021   Lab Results  Component Value Date   ALT 19 03/03/2021   AST 18 03/03/2021   ALKPHOS 86 03/03/2021   BILITOT 0.4 03/03/2021   Lab Results  Component Value Date   HGBA1C 6.1 (H) 03/03/2021   Lab Results  Component Value Date   INSULIN 8.5 03/03/2021   Lab Results  Component Value Date   TSH 1.630 03/03/2021   Lab Results  Component Value Date   CHOL 225 (H) 03/03/2021   HDL 72 03/03/2021   LDLCALC 136 (H) 03/03/2021   TRIG 95 03/03/2021   CHOLHDL 3.0 01/11/2018   Lab Results  Component Value Date   VD25OH 65.6 03/03/2021   VD25OH 53.3 02/23/2017   VD25OH 53 01/13/2016   Lab Results  Component Value Date   WBC 9.2 03/03/2021   HGB 14.7 03/03/2021   HCT 46.2 03/03/2021   MCV 90 03/03/2021   PLT 285 03/03/2021   No results found for: IRON, TIBC, FERRITIN  Attestation Statements:   Reviewed by clinician on day of visit: allergies, medications, problem list, medical history, surgical history, family history, social history, and previous encounter notes.   05/04/2021, am acting as transcriptionist for Trude Mcburney, DO.  I have reviewed the above documentation for accuracy and completeness, and I agree with the above. [employer], D.O.  The 21st Century Cures Act was signed into law in 2016 which includes the topic of electronic health records.  This provides immediate access to information in MyChart.  This includes consultation notes, operative notes, office notes, lab results and pathology reports.  If you have any questions about  what you read please let 2017 know at your next visit so we can discuss your concerns and take corrective action if need be.  We are right here with you.

## 2021-05-25 ENCOUNTER — Other Ambulatory Visit: Payer: Self-pay | Admitting: Physician Assistant

## 2021-05-25 DIAGNOSIS — R928 Other abnormal and inconclusive findings on diagnostic imaging of breast: Secondary | ICD-10-CM

## 2021-05-27 ENCOUNTER — Ambulatory Visit (INDEPENDENT_AMBULATORY_CARE_PROVIDER_SITE_OTHER): Payer: Managed Care, Other (non HMO) | Admitting: Family Medicine

## 2021-05-27 ENCOUNTER — Other Ambulatory Visit: Payer: Self-pay

## 2021-05-27 ENCOUNTER — Encounter (INDEPENDENT_AMBULATORY_CARE_PROVIDER_SITE_OTHER): Payer: Self-pay | Admitting: Family Medicine

## 2021-05-27 VITALS — BP 109/71 | HR 64 | Temp 98.0°F | Ht 67.0 in | Wt 204.0 lb

## 2021-05-27 DIAGNOSIS — J3089 Other allergic rhinitis: Secondary | ICD-10-CM

## 2021-05-27 DIAGNOSIS — E669 Obesity, unspecified: Secondary | ICD-10-CM

## 2021-05-27 DIAGNOSIS — Z6834 Body mass index (BMI) 34.0-34.9, adult: Secondary | ICD-10-CM | POA: Diagnosis not present

## 2021-05-27 DIAGNOSIS — Z9189 Other specified personal risk factors, not elsewhere classified: Secondary | ICD-10-CM | POA: Diagnosis not present

## 2021-05-27 MED ORDER — LEVOCETIRIZINE DIHYDROCHLORIDE 5 MG PO TABS: 5.0000 mg | ORAL_TABLET | Freq: Every evening | ORAL | 0 refills | Status: DC

## 2021-05-28 NOTE — Progress Notes (Signed)
Chief Complaint:   OBESITY Katherine Adams is here to discuss her progress with her obesity treatment plan along with follow-up of her obesity related diagnoses. Katherine Adams is on the Category 1 Plan and states she is following her eating plan approximately 90-95% of the time. Katherine Adams states she is doing 0 minutes 0 times per week.  Today's visit was #: 7 Starting weight: 218 lbs Starting date: 03/03/2021 Today's weight: 204 lbs Today's date: 05/27/2021 Total lbs lost to date: 14 Total lbs lost since last in-office visit: 2  Interim History: Katherine Adams is here for a follow up office visit.  We reviewed her meal plan and questions were answered.  Patient's food recall appears to be accurate and consistent with what is on plan when she is following it. When eating on plan, her hunger and cravings are well controlled. Katherine Adams hits her protein goals most days, but her calories are below goal. She is drinking very little water, and her intake is between 12-60 oz of water at most (on average 30 oz).   Subjective:   1. Environmental and seasonal allergies Katherine Adams started Xyzal 2 office visits ago, and she is tolerating it well with no side effects.  2. At risk for dehydration Katherine Adams is at risk for dehydration due to poor water intake.  Assessment/Plan:  No orders of the defined types were placed in this encounter.   Medications Discontinued During This Encounter  Medication Reason   levocetirizine (XYZAL) 5 MG tablet Reorder     Meds ordered this encounter  Medications   levocetirizine (XYZAL) 5 MG tablet    Sig: Take 1 tablet (5 mg total) by mouth every evening.    Dispense:  30 tablet    Refill:  0     1. Environmental and seasonal allergies We will refill Xyzal, and we will follow up at Katherine Adams's next office visit.  - levocetirizine (XYZAL) 5 MG tablet; Take 1 tablet (5 mg total) by mouth every evening.  Dispense: 30 tablet; Refill: 0  2. At risk for dehydration Katherine Adams was given approximately 9 minutes  dehydration prevention counseling today. Katherine Adams is at risk for dehydration due to weight loss and current medication(s). She was encouraged to hydrate and monitor fluid status to avoid dehydration as well as weight loss plateaus.   3. Obesity with current BMI of 32.0 Katherine Adams is currently in the action stage of change. As such, her goal is to continue with weight loss efforts. She has agreed to the Category 1 Plan and keeping a food journal and adhering to recommended goals of 1000-1100 calories and 80 grams of protein daily.   Katherine Adams is to bring in her journaling log to her next office visit.  Exercise goals: All adults should avoid inactivity. Some physical activity is better than none, and adults who participate in any amount of physical activity gain some health benefits.  Behavioral modification strategies: increasing water intake and keeping a strict food journal.  Katherine Adams has agreed to follow-up with our clinic in 3 weeks. She was informed of the importance of frequent follow-up visits to maximize her success with intensive lifestyle modifications for her multiple health conditions.   Objective:   Blood pressure 109/71, pulse 64, temperature 98 F (36.7 C), height 5\' 7"  (1.702 m), weight 204 lb (92.5 kg), last menstrual period 12/21/2013, SpO2 96 %. Body mass index is 31.95 kg/m.  General: Cooperative, alert, well developed, in no acute distress. HEENT: Conjunctivae and lids unremarkable. Cardiovascular: Regular rhythm.  Lungs: Normal work of breathing. Neurologic: No focal deficits.   Lab Results  Component Value Date   CREATININE 0.84 03/03/2021   BUN 23 03/03/2021   NA 142 03/03/2021   K 4.4 03/03/2021   CL 101 03/03/2021   CO2 27 03/03/2021   Lab Results  Component Value Date   ALT 19 03/03/2021   AST 18 03/03/2021   ALKPHOS 86 03/03/2021   BILITOT 0.4 03/03/2021   Lab Results  Component Value Date   HGBA1C 6.1 (H) 03/03/2021   Lab Results  Component Value Date   INSULIN  8.5 03/03/2021   Lab Results  Component Value Date   TSH 1.630 03/03/2021   Lab Results  Component Value Date   CHOL 225 (H) 03/03/2021   HDL 72 03/03/2021   LDLCALC 136 (H) 03/03/2021   TRIG 95 03/03/2021   CHOLHDL 3.0 01/11/2018   Lab Results  Component Value Date   VD25OH 65.6 03/03/2021   VD25OH 53.3 02/23/2017   VD25OH 53 01/13/2016   Lab Results  Component Value Date   WBC 9.2 03/03/2021   HGB 14.7 03/03/2021   HCT 46.2 03/03/2021   MCV 90 03/03/2021   PLT 285 03/03/2021   No results found for: IRON, TIBC, FERRITIN  Attestation Statements:   Reviewed by clinician on day of visit: allergies, medications, problem list, medical history, surgical history, family history, social history, and previous encounter notes.   Trude Mcburney, am acting as transcriptionist for [employer], DO.  I have reviewed the above documentation for accuracy and completeness, and I agree with the above. Carlye Grippe, D.O.  The 21st Century Cures Act was signed into law in 2016 which includes the topic of electronic health records.  This provides immediate access to information in MyChart.  This includes consultation notes, operative notes, office notes, lab results and pathology reports.  If you have any questions about what you read please let us know at your next visit so we can discuss your concerns and take corrective action if need be.  We are right here with you.

## 2021-06-09 ENCOUNTER — Ambulatory Visit (INDEPENDENT_AMBULATORY_CARE_PROVIDER_SITE_OTHER): Payer: Managed Care, Other (non HMO) | Admitting: Family Medicine

## 2021-06-23 ENCOUNTER — Other Ambulatory Visit: Payer: Self-pay | Admitting: Physician Assistant

## 2021-06-23 DIAGNOSIS — N6320 Unspecified lump in the left breast, unspecified quadrant: Secondary | ICD-10-CM

## 2021-06-24 ENCOUNTER — Other Ambulatory Visit: Payer: Self-pay | Admitting: Obstetrics and Gynecology

## 2021-06-24 DIAGNOSIS — N6320 Unspecified lump in the left breast, unspecified quadrant: Secondary | ICD-10-CM

## 2021-06-25 ENCOUNTER — Ambulatory Visit
Admission: RE | Admit: 2021-06-25 | Discharge: 2021-06-25 | Disposition: A | Discharge status: Home / Self Care | Payer: Managed Care, Other (non HMO) | Source: Ambulatory Visit | Attending: Physician Assistant | Admitting: Physician Assistant

## 2021-06-25 ENCOUNTER — Encounter (INDEPENDENT_AMBULATORY_CARE_PROVIDER_SITE_OTHER): Payer: Self-pay | Admitting: Family Medicine

## 2021-06-25 ENCOUNTER — Ambulatory Visit (INDEPENDENT_AMBULATORY_CARE_PROVIDER_SITE_OTHER): Payer: Managed Care, Other (non HMO) | Admitting: Family Medicine

## 2021-06-25 ENCOUNTER — Other Ambulatory Visit: Payer: Self-pay

## 2021-06-25 VITALS — BP 118/80 | HR 61 | Temp 98.2°F | Ht 67.0 in | Wt 201.0 lb

## 2021-06-25 DIAGNOSIS — Z6834 Body mass index (BMI) 34.0-34.9, adult: Secondary | ICD-10-CM | POA: Diagnosis not present

## 2021-06-25 DIAGNOSIS — Z9189 Other specified personal risk factors, not elsewhere classified: Secondary | ICD-10-CM

## 2021-06-25 DIAGNOSIS — R7303 Prediabetes: Secondary | ICD-10-CM

## 2021-06-25 DIAGNOSIS — J3089 Other allergic rhinitis: Secondary | ICD-10-CM | POA: Diagnosis not present

## 2021-06-25 DIAGNOSIS — E669 Obesity, unspecified: Secondary | ICD-10-CM

## 2021-06-25 DIAGNOSIS — N6320 Unspecified lump in the left breast, unspecified quadrant: Secondary | ICD-10-CM

## 2021-06-25 MED ORDER — LEVOCETIRIZINE DIHYDROCHLORIDE 5 MG PO TABS: 5.0000 mg | ORAL_TABLET | Freq: Every evening | ORAL | 0 refills | Status: DC

## 2021-06-29 NOTE — Progress Notes (Signed)
Chief Complaint:   OBESITY Katherine Adams is here to discuss her progress with her obesity treatment plan along with follow-up of her obesity related diagnoses. Katherine Adams is on the Category 1 Plan or keeping a food journal and adhering to recommended goals of 1000-1100 calories and 80 grams of protein daily and states she is following her eating plan approximately 85-90% of the time. Katherine Adams states she is doing yard work for 2-4 hours 2 times per week.  Today's visit was #: 8 Starting weight: 218 lbs Starting date: 03/03/2021 Today's weight: 201 lbs Today's date: 06/25/2021 Total lbs lost to date: 17 Total lbs lost since last in-office visit: 3  Interim History: Katherine Adams lost 3 lbs despite her birthday, and she is very happy. She is only hitting 40 oz of water per day on average. She really increased her acivity more lately which helped with weight loss.  Subjective:   1. Pre-diabetes Katherine Adams has a diagnosis of pre-diabetes based on her elevated HgA1c and was informed this puts her at greater risk of developing diabetes. Her last A1c was 6.1, and she is not on medications. She continues to work on diet and exercise to decrease her risk of diabetes. She denies nausea or hypoglycemia.  2. Environmental and seasonal allergies Katherine Adams notes that Xyzal is working well with no side effects. It is helping with all of the outside work she does, especially this time of year.  3. At risk for dehydration Katherine Adams is at risk for dehydration due to poor water intake.  Assessment/Plan:  No orders of the defined types were placed in this encounter.   Medications Discontinued During This Encounter  Medication Reason   levocetirizine (XYZAL) 5 MG tablet Reorder     Meds ordered this encounter  Medications   levocetirizine (XYZAL) 5 MG tablet    Sig: Take 1 tablet (5 mg total) by mouth every evening.    Dispense:  30 tablet    Refill:  0    30 d supply;  ** OV for RF **   Do not send RF request     1. Pre-diabetes Katherine Adams  will continue to work on weight loss, exercise, and decreasing simple carbohydrates to help decrease the risk of diabetes. We will recheck A1c at her next office visit.  2. Environmental and seasonal allergies Katherine Adams will continue Xyzal 5 mg q daily, and we will refill for 1 month.  - levocetirizine (XYZAL) 5 MG tablet; Take 1 tablet (5 mg total) by mouth every evening.  Dispense: 30 tablet; Refill: 0  3. At risk for dehydration Katherine Adams was given approximately 15 minutes dehydration prevention counseling today. Katherine Adams is at risk for dehydration due to weight loss and current medication(s). She was encouraged to hydrate and monitor fluid status to avoid dehydration as well as weight loss plateaus.   4. Obesity with current BMI of 31.6 Katherine Adams is currently in the action stage of change. As such, her goal is to continue with weight loss efforts. She has agreed to the Category 1 Plan or keeping a food journal and adhering to recommended goals of 1000-1100 calories and 80 grams of protein daily.   Katherine Adams is to focus on hitting her protein and calorie goals.  We will recheck A1c at her next office visit.  Exercise goals: 30 minutes of walking 3 days or more.   Behavioral modification strategies: increasing lean protein intake and increasing water intake.  Katherine Adams has agreed to follow-up with our clinic in 1-2 weeks.  She was informed of the importance of frequent follow-up visits to maximize her success with intensive lifestyle modifications for her multiple health conditions.   Objective:   Blood pressure 118/80, pulse 61, temperature 98.2 F (36.8 C), height 5\' 7"  (1.702 m), weight 201 lb (91.2 kg), last menstrual period 12/21/2013, SpO2 98 %. Body mass index is 31.48 kg/m.  General: Cooperative, alert, well developed, in no acute distress. HEENT: Conjunctivae and lids unremarkable. Cardiovascular: Regular rhythm.  Lungs: Normal work of breathing. Neurologic: No focal deficits.   Lab Results  Component  Value Date   CREATININE 0.84 03/03/2021   BUN 23 03/03/2021   NA 142 03/03/2021   K 4.4 03/03/2021   CL 101 03/03/2021   CO2 27 03/03/2021   Lab Results  Component Value Date   ALT 19 03/03/2021   AST 18 03/03/2021   ALKPHOS 86 03/03/2021   BILITOT 0.4 03/03/2021   Lab Results  Component Value Date   HGBA1C 6.1 (H) 03/03/2021   Lab Results  Component Value Date   INSULIN 8.5 03/03/2021   Lab Results  Component Value Date   TSH 1.630 03/03/2021   Lab Results  Component Value Date   CHOL 225 (H) 03/03/2021   HDL 72 03/03/2021   LDLCALC 136 (H) 03/03/2021   TRIG 95 03/03/2021   CHOLHDL 3.0 01/11/2018   Lab Results  Component Value Date   VD25OH 65.6 03/03/2021   VD25OH 53.3 02/23/2017   VD25OH 53 01/13/2016   Lab Results  Component Value Date   WBC 9.2 03/03/2021   HGB 14.7 03/03/2021   HCT 46.2 03/03/2021   MCV 90 03/03/2021   PLT 285 03/03/2021   No results found for: IRON, TIBC, FERRITIN  Attestation Statements:   Reviewed by clinician on day of visit: allergies, medications, problem list, medical history, surgical history, family history, social history, and previous encounter notes.   05/04/2021, am acting as transcriptionist for Katherine Mcburney, DO.  I have reviewed the above documentation for accuracy and completeness, and I agree with the above. [employer] [facility], D.O.  The 21st Century Cures Act was signed into law in 2016 which includes the topic of electronic health records.  This provides immediate access to information in MyChart.  This includes consultation notes, operative notes, office notes, lab results and pathology reports.  If you have any questions about what you read please let 2017 know at your next visit so we can discuss your concerns and take corrective action if need be.  We are right here with you.

## 2021-07-09 ENCOUNTER — Ambulatory Visit (INDEPENDENT_AMBULATORY_CARE_PROVIDER_SITE_OTHER): Payer: Managed Care, Other (non HMO) | Admitting: Family Medicine

## 2021-07-15 ENCOUNTER — Ambulatory Visit (INDEPENDENT_AMBULATORY_CARE_PROVIDER_SITE_OTHER): Payer: Managed Care, Other (non HMO) | Admitting: Family Medicine

## 2021-07-15 ENCOUNTER — Encounter (INDEPENDENT_AMBULATORY_CARE_PROVIDER_SITE_OTHER): Payer: Self-pay | Admitting: Family Medicine

## 2021-07-15 ENCOUNTER — Other Ambulatory Visit: Payer: Self-pay

## 2021-07-15 VITALS — BP 116/73 | HR 64 | Temp 98.2°F | Ht 67.0 in | Wt 196.0 lb

## 2021-07-15 DIAGNOSIS — R7303 Prediabetes: Secondary | ICD-10-CM

## 2021-07-15 DIAGNOSIS — E669 Obesity, unspecified: Secondary | ICD-10-CM

## 2021-07-15 DIAGNOSIS — J3089 Other allergic rhinitis: Secondary | ICD-10-CM | POA: Diagnosis not present

## 2021-07-15 DIAGNOSIS — Z9189 Other specified personal risk factors, not elsewhere classified: Secondary | ICD-10-CM | POA: Diagnosis not present

## 2021-07-15 DIAGNOSIS — Z6834 Body mass index (BMI) 34.0-34.9, adult: Secondary | ICD-10-CM | POA: Diagnosis not present

## 2021-07-15 MED ORDER — LEVOCETIRIZINE DIHYDROCHLORIDE 5 MG PO TABS: 5.0000 mg | ORAL_TABLET | Freq: Every evening | ORAL | 0 refills | Status: DC

## 2021-07-16 LAB — HEMOGLOBIN A1C
Est. average glucose Bld gHb Est-mCnc: 111 mg/dL
Hgb A1c MFr Bld: 5.5 % (ref 4.8–5.6)

## 2021-07-16 NOTE — Progress Notes (Signed)
Chief Complaint:   OBESITY Katherine Adams is here to discuss her progress with her obesity treatment plan along with follow-up of her obesity related diagnoses. Katherine Adams is on the Category 1 Plan or keeping a food journal and adhering to recommended goals of 1000-1100 calories and 80 grams of protein daily and states she is following her eating plan approximately 95-100% of the time. Katherine Adams states she is walking for 30+ minutes 3-4 times per week.  Today's visit was #: 9 Starting weight: 218 lbs Starting date: 03/03/2021 Today's weight: 196 lbs Today's date: 07/15/2021 Total lbs lost to date: 22 Total lbs lost since last in-office visit: 5  Interim History: Katherine Adams feels that the key to her success is in the increase in activity. Overall she hit her calorie and protein goals. She also did better with drinking water for the past couple of weeks (3 bottles per day). She is doing great with her journaling. She has no concerns about Thanksgiving day.  Subjective:   1. Pre-diabetes Katherine Adams has a diagnosis of pre-diabetes based on her elevated HgA1c and was informed this puts her at greater risk of developing diabetes. She is not on medications. She continues to work on diet and exercise to decrease her risk of diabetes. She denies nausea or hypoglycemia.  2. Environmental and seasonal allergies Katherine Adams has seasonal allergies that have been more bothersome with the change of weather/season. She is taking Xyzal.   3. At risk for dehydration Katherine Adams is at risk for dehydration due to only drinking 3 bottles of water per day.  Assessment/Plan:   Orders Placed This Encounter  Procedures   Hemoglobin A1c    Medications Discontinued During This Encounter  Medication Reason   levocetirizine (XYZAL) 5 MG tablet Reorder     Meds ordered this encounter  Medications   levocetirizine (XYZAL) 5 MG tablet    Sig: Take 1 tablet (5 mg total) by mouth every evening.    Dispense:  30 tablet    Refill:  0    30 d  supply;  ** OV for RF **   Do not send RF request     1. Pre-diabetes We will recheck A1c today. Katherine Adams will continue her prudent nutritional plan and weight loss, and increase her exercise as tolerated.   - Hemoglobin A1c  2. Environmental and seasonal allergies Katherine Adams will continue Xyzal 5 mg qhs, and we will refill for 1 month.    Counseling: - Seasonal and environmental allergies and pathophysiology of disease process discussed with patient.  - Preventative strategies as first line for management discussed.  I encouraged use of KN or N-95 mask use as well as sterile saline rinses such as Lloyd Huger Med or AYR sinus rinses to be done twice daily and after any prolonged exposure to the environment or allergen.    It is best to use distilled water or previously boiled water    - If eyes are itchy or irritated feeling when your seasonal allergies get bad, ok to use Naphcon-A over-the-counter eyedrops as needed - If continues to worsen despite preventative strategies, take over-the-counter meds such as Allegra, Xyzal etc daily during allergy seasons or nasal steroids such as Flonase, Rhinocort and the like - We can consider Astelin nasal spray, if symptoms are not well controlled with OTC medications, nasal rinses and preventative strategies. - Encouraged to return to clinic or call the office today discussed further questions or concerns they may have.  - levocetirizine (XYZAL) 5 MG  tablet; Take 1 tablet (5 mg total) by mouth every evening.  Dispense: 30 tablet; Refill: 0  3. At risk for dehydration Katherine Adams is at higher than average risk of dehydration.  Katherine Adams was given more than 9 minutes of proper hydration counseling today.  We discussed the signs and symptoms of dehydration, some of which may include muscle cramping, constipation or even orthostatic symptoms.  Counseling on the prevention of dehydration was also provided today.  Katherine Adams is at risk for dehydration due to weight loss, lifestyle and behavorial  habits and possibly due to taking certain medication(s).  She was encouraged to adequately hydrate and monitor fluid status to avoid dehydration as well as weight loss plateaus.  Unless pre-existing renal or cardiopulmonary conditions exist, in which patient was told to limit their fluid intake, I recommended roughly one half of their weight in pounds to be the approximate ounces of non-caloric, non-caffeinated beverages they should drink per day; including more if they are engaging in exercise.  4. Obesity with current BMI of 30.8 Katherine Adams is currently in the action stage of change. As such, her goal is to continue with weight loss efforts. She has agreed to the Category 1 Plan or keeping a food journal and adhering to recommended goals of 1000-1100 calories and 80 grams of protein daily.   Exercise goals: As is, and increase as tolerated.  Behavioral modification strategies: increasing water intake, holiday eating strategies (Thanksgiving), and planning for success.  Katherine Adams has agreed to follow-up with our clinic in 3 weeks. She was informed of the importance of frequent follow-up visits to maximize her success with intensive lifestyle modifications for her multiple health conditions.   Objective:   Blood pressure 116/73, pulse 64, temperature 98.2 F (36.8 C), height 5\' 7"  (1.702 m), weight 196 lb (88.9 kg), last menstrual period 12/21/2013, SpO2 98 %. Body mass index is 30.7 kg/m.  General: Cooperative, alert, well developed, in no acute distress. HEENT: Conjunctivae and lids unremarkable. Cardiovascular: Regular rhythm.  Lungs: Normal work of breathing. Neurologic: No focal deficits.   Lab Results  Component Value Date   CREATININE 0.84 03/03/2021   BUN 23 03/03/2021   NA 142 03/03/2021   K 4.4 03/03/2021   CL 101 03/03/2021   CO2 27 03/03/2021   Lab Results  Component Value Date   ALT 19 03/03/2021   AST 18 03/03/2021   ALKPHOS 86 03/03/2021   BILITOT 0.4 03/03/2021   Lab  Results  Component Value Date   HGBA1C 5.5 07/15/2021   HGBA1C 6.1 (H) 03/03/2021   Lab Results  Component Value Date   INSULIN 8.5 03/03/2021   Lab Results  Component Value Date   TSH 1.630 03/03/2021   Lab Results  Component Value Date   CHOL 225 (H) 03/03/2021   HDL 72 03/03/2021   LDLCALC 136 (H) 03/03/2021   TRIG 95 03/03/2021   CHOLHDL 3.0 01/11/2018   Lab Results  Component Value Date   VD25OH 65.6 03/03/2021   VD25OH 53.3 02/23/2017   VD25OH 53 01/13/2016   Lab Results  Component Value Date   WBC 9.2 03/03/2021   HGB 14.7 03/03/2021   HCT 46.2 03/03/2021   MCV 90 03/03/2021   PLT 285 03/03/2021   No results found for: IRON, TIBC, FERRITIN  Attestation Statements:   Reviewed by clinician on day of visit: allergies, medications, problem list, medical history, surgical history, family history, social history, and previous encounter notes.   05/04/2021, am acting as transcriptionist  for [employer], DO.  I have reviewed the above documentation for accuracy and completeness, and I agree with the above. Marjory Sneddon, D.O.  The [place] was signed into law in 2016 which includes the topic of electronic health records.  This provides immediate access to information in MyChart.  This includes consultation notes, operative notes, office notes, lab results and pathology reports.  If you have any questions about what you read please let us know at your next visit so we can discuss your concerns and take corrective action if need be.  We are right here with you.

## 2021-08-05 ENCOUNTER — Other Ambulatory Visit: Payer: Self-pay

## 2021-08-05 ENCOUNTER — Ambulatory Visit (INDEPENDENT_AMBULATORY_CARE_PROVIDER_SITE_OTHER): Payer: Managed Care, Other (non HMO) | Admitting: Family Medicine

## 2021-08-05 ENCOUNTER — Encounter (INDEPENDENT_AMBULATORY_CARE_PROVIDER_SITE_OTHER): Payer: Self-pay | Admitting: Family Medicine

## 2021-08-05 VITALS — BP 124/79 | HR 58 | Temp 98.2°F | Ht 67.0 in | Wt 195.0 lb

## 2021-08-05 DIAGNOSIS — Z6834 Body mass index (BMI) 34.0-34.9, adult: Secondary | ICD-10-CM

## 2021-08-05 DIAGNOSIS — E66811 Obesity, class 1: Secondary | ICD-10-CM

## 2021-08-05 DIAGNOSIS — J3089 Other allergic rhinitis: Secondary | ICD-10-CM

## 2021-08-05 DIAGNOSIS — R7303 Prediabetes: Secondary | ICD-10-CM

## 2021-08-05 DIAGNOSIS — E669 Obesity, unspecified: Secondary | ICD-10-CM | POA: Diagnosis not present

## 2021-08-05 DIAGNOSIS — Z9189 Other specified personal risk factors, not elsewhere classified: Secondary | ICD-10-CM | POA: Diagnosis not present

## 2021-08-05 MED ORDER — LEVOCETIRIZINE DIHYDROCHLORIDE 5 MG PO TABS: 5.0000 mg | ORAL_TABLET | Freq: Every evening | ORAL | 0 refills | Status: DC

## 2021-08-06 NOTE — Progress Notes (Signed)
Chief Complaint:   OBESITY Katherine Adams is here to discuss her progress with her obesity treatment plan along with follow-up of her obesity related diagnoses. Katherine Adams is on the Category 1 Plan and keeping a food journal and adhering to recommended goals of 1000-1100 calories and 80 grams protein and states she is following her eating plan approximately 85% of the time. Katherine Adams states she is walking 30 minutes 1-2 times per week.  Today's visit was #: 10 Starting weight: 218 lbs Starting date: 03/03/2021 Today's weight: 195 lbs Today's date: 08/05/2021 Total lbs lost to date: 23 Total lbs lost since last in-office visit: 1  Interim History: Katherine Adams is here for a follow up office visit. We reviewed her meal plan and questions were answered. Patient's food recall appears to be accurate and consistent with what is on plan when she is following it. When eating on plan, her hunger and cravings are well controlled. Pt tried to increase activity over the holiday and did a great job logging, except for 6-7 days. Overall, she did an excellent job. Hunger and cravings are controlled.  Subjective:   1. Prediabetes Eulonda denies hunger or cravings.  2. Environmental and seasonal allergies Brandye Inthavong is tolerating medication(s) well without side effects.  Medication compliance is good and patient appears to be taking it as prescribed.  Denies additional concerns regarding this condition.   3. At risk for diabetes mellitus Marishka is at higher than average risk for developing diabetes due to obesity.   Assessment/Plan:  No orders of the defined types were placed in this encounter.   Medications Discontinued During This Encounter  Medication Reason   levocetirizine (XYZAL) 5 MG tablet Reorder     Meds ordered this encounter  Medications   levocetirizine (XYZAL) 5 MG tablet    Sig: Take 1 tablet (5 mg total) by mouth every evening.    Dispense:  30 tablet    Refill:  0    30 d supply;  ** OV for RF **   Do  not send RF request     1. Prediabetes Katherine Adams will continue to work on weight loss, exercise, and decreasing simple carbohydrates to help decrease the risk of diabetes. Pt declines medication.  2. Environmental and seasonal allergies Continue current treatment plan.  Refill- levocetirizine (XYZAL) 5 MG tablet; Take 1 tablet (5 mg total) by mouth every evening.  Dispense: 30 tablet; Refill: 0  3. At risk for diabetes mellitus Katherine Adams was given approximately >8 minutes of diabetes education and counseling today. We discussed intensive lifestyle modifications today with an emphasis on weight loss as well as increasing exercise and decreasing simple carbohydrates in her diet. We also reviewed medication options with an emphasis on risk versus benefit of those discussed.   Repetitive spaced learning was employed today to elicit superior memory formation and behavioral change.  4. Obesity with current BMI of 30.6  Katherine Adams is currently in the action stage of change. As such, her goal is to continue with weight loss efforts. She has agreed to the Category 1 Plan and keeping a food journal and adhering to recommended goals of 1000-1100 calories and 80 grams protein.   Exercise goals: For substantial health benefits, adults should do at least 150 minutes (2 hours and 30 minutes) a week of moderate-intensity, or 75 minutes (1 hour and 15 minutes) a week of vigorous-intensity aerobic physical activity, or an equivalent combination of moderate- and vigorous-intensity aerobic activity. Aerobic activity should be performed  in episodes of at least 10 minutes, and preferably, it should be spread throughout the week.  Behavioral modification strategies: increasing lean protein intake, decreasing simple carbohydrates, and keeping a strict food journal.  Laporchia has agreed to follow-up with our clinic in 2-4 weeks. She was informed of the importance of frequent follow-up visits to maximize her success with intensive lifestyle  modifications for her multiple health conditions.   Objective:   Blood pressure 124/79, pulse (!) 58, temperature 98.2 F (36.8 C), height 5\' 7"  (1.702 m), weight 195 lb (88.5 kg), last menstrual period 12/21/2013, SpO2 98 %. Body mass index is 30.54 kg/m.  General: Cooperative, alert, well developed, in no acute distress. HEENT: Conjunctivae and lids unremarkable. Cardiovascular: Regular rhythm.  Lungs: Normal work of breathing. Neurologic: No focal deficits.   Lab Results  Component Value Date   CREATININE 0.84 03/03/2021   BUN 23 03/03/2021   NA 142 03/03/2021   K 4.4 03/03/2021   CL 101 03/03/2021   CO2 27 03/03/2021   Lab Results  Component Value Date   ALT 19 03/03/2021   AST 18 03/03/2021   ALKPHOS 86 03/03/2021   BILITOT 0.4 03/03/2021   Lab Results  Component Value Date   HGBA1C 5.5 07/15/2021   HGBA1C 6.1 (H) 03/03/2021   Lab Results  Component Value Date   INSULIN 8.5 03/03/2021   Lab Results  Component Value Date   TSH 1.630 03/03/2021   Lab Results  Component Value Date   CHOL 225 (H) 03/03/2021   HDL 72 03/03/2021   LDLCALC 136 (H) 03/03/2021   TRIG 95 03/03/2021   CHOLHDL 3.0 01/11/2018   Lab Results  Component Value Date   VD25OH 65.6 03/03/2021   VD25OH 53.3 02/23/2017   VD25OH 53 01/13/2016   Lab Results  Component Value Date   WBC 9.2 03/03/2021   HGB 14.7 03/03/2021   HCT 46.2 03/03/2021   MCV 90 03/03/2021   PLT 285 03/03/2021    Attestation Statements:   Reviewed by clinician on day of visit: allergies, medications, problem list, medical history, surgical history, family history, social history, and previous encounter notes.  05/04/2021, CMA, am acting as transcriptionist for Edmund Hilda, DO.  I have reviewed the above documentation for accuracy and completeness, and I agree with the above. [employer], D.O.  The 21st Century Cures Act was signed into law in 2016 which includes the topic of  electronic health records.  This provides immediate access to information in MyChart.  This includes consultation notes, operative notes, office notes, lab results and pathology reports.  If you have any questions about what you read please let 2017 know at your next visit so we can discuss your concerns and take corrective action if need be.  We are right here with you.

## 2021-09-03 ENCOUNTER — Encounter (INDEPENDENT_AMBULATORY_CARE_PROVIDER_SITE_OTHER): Payer: Self-pay | Admitting: Family Medicine

## 2021-09-03 ENCOUNTER — Other Ambulatory Visit: Payer: Self-pay

## 2021-09-03 ENCOUNTER — Ambulatory Visit (INDEPENDENT_AMBULATORY_CARE_PROVIDER_SITE_OTHER): Payer: Managed Care, Other (non HMO) | Admitting: Family Medicine

## 2021-09-03 VITALS — BP 106/65 | HR 62 | Temp 97.9°F | Ht 67.0 in | Wt 196.0 lb

## 2021-09-03 DIAGNOSIS — E559 Vitamin D deficiency, unspecified: Secondary | ICD-10-CM | POA: Diagnosis not present

## 2021-09-03 DIAGNOSIS — Z9189 Other specified personal risk factors, not elsewhere classified: Secondary | ICD-10-CM

## 2021-09-03 DIAGNOSIS — E669 Obesity, unspecified: Secondary | ICD-10-CM | POA: Diagnosis not present

## 2021-09-03 DIAGNOSIS — R7303 Prediabetes: Secondary | ICD-10-CM | POA: Diagnosis not present

## 2021-09-03 DIAGNOSIS — Z6834 Body mass index (BMI) 34.0-34.9, adult: Secondary | ICD-10-CM

## 2021-09-03 DIAGNOSIS — J3089 Other allergic rhinitis: Secondary | ICD-10-CM

## 2021-09-03 DIAGNOSIS — Z6830 Body mass index (BMI) 30.0-30.9, adult: Secondary | ICD-10-CM

## 2021-09-03 MED ORDER — LEVOCETIRIZINE DIHYDROCHLORIDE 5 MG PO TABS: 5.0000 mg | ORAL_TABLET | Freq: Every evening | ORAL | 0 refills | Status: DC

## 2021-09-07 NOTE — Progress Notes (Signed)
Chief Complaint:   OBESITY Katherine Adams is here to discuss her progress with her obesity treatment plan along with follow-up of her obesity related diagnoses. Katherine Adams is on the Category 1 Plan and keeping a food journal and adhering to recommended goals of 1000-1100 calories and 80+ grams protein and states she is following her eating plan approximately 50% of the time. Katherine Adams states she is walking 15-20 minutes 2 times per week.  Today's visit was #: 11 Starting weight: 218 lbs Starting date: 03/03/2021 Today's weight: 196 lbs Today's date: 09/03/2021 Total lbs lost to date: 22 Total lbs lost since last in-office visit: +1  Interim History: Pt was logging for the most part and was using [facility]. She is practicing mindful eating.  Subjective:   1. Prediabetes Pt denies carb cravings and hunger is controlled when on plan. Her A1c was 6.1 about 6 months ago and 5.5 about a month ago.  2. Environmental and seasonal allergies Symptoms are well controlled with current meds. Pt notes less drowsiness in the morning versus with Zyrtec.  3. Vitamin D deficiency Vit D at last check was at goal of 65.6 about 6 months ago. She is taking OTC 2,000 IU QD.  4. At risk for impaired metabolic function Katherine Adams is at increased risk for impaired metabolic function due to current nutrition and muscle mass.  Assessment/Plan:  No orders of the defined types were placed in this encounter.   Medications Discontinued During This Encounter  Medication Reason   levocetirizine (XYZAL) 5 MG tablet Reorder     Meds ordered this encounter  Medications   levocetirizine (XYZAL) 5 MG tablet    Sig: Take 1 tablet (5 mg total) by mouth every evening.    Dispense:  30 tablet    Refill:  0    30 d supply;  ** OV for RF **   Do not send RF request     1. Prediabetes Katherine Adams will continue to work on weight loss, exercise, prudent nutritional plan, and decreasing simple carbohydrates to help decrease the risk of diabetes.   2.  Environmental and seasonal allergies Continue current treatment plan.  Refill- levocetirizine (XYZAL) 5 MG tablet; Take 1 tablet (5 mg total) by mouth every evening.  Dispense: 30 tablet; Refill: 0  3. Vitamin D deficiency Low Vitamin D level contributes to fatigue and are associated with obesity, breast, and colon cancer. She agrees to continue to take OTC Vitamin D 2,000 IU QD and will follow-up for routine testing of Vitamin D, at least 2-3 times per year to avoid over-replacement.  4. At risk for impaired metabolic function Katherine Adams was given approximately 9 minutes of impaired  metabolic function prevention counseling today. We discussed intensive lifestyle modifications today with an emphasis on specific nutrition and exercise instructions and strategies.   Repetitive spaced learning was employed today to elicit superior memory formation and behavioral change.  5. Obesity with current BMI of 30.8  Katherine Adams is currently in the action stage of change. As such, her goal is to continue with weight loss efforts. She has agreed to the Category 1 Plan and keeping a food journal and adhering to recommended goals of 1000-1100 calories and 80+ grams protein.   Pt desires to get back to logging calories and protein, as that helped her stay on track.  Exercise goals:  Pt desires to "move more" in the new year.  Behavioral modification strategies: meal planning and cooking strategies and planning for success.  Katherine Adams has  agreed to follow-up with our clinic in 3 weeks. She was informed of the importance of frequent follow-up visits to maximize her success with intensive lifestyle modifications for her multiple health conditions.   Objective:   Blood pressure 106/65, pulse 62, temperature 97.9 F (36.6 C), height 5\' 7"  (1.702 m), weight 196 lb (88.9 kg), last menstrual period 12/21/2013, SpO2 96 %. Body mass index is 30.7 kg/m.  General: Cooperative, alert, well developed, in no acute distress. HEENT:  Conjunctivae and lids unremarkable. Cardiovascular: Regular rhythm.  Lungs: Normal work of breathing. Neurologic: No focal deficits.   Lab Results  Component Value Date   CREATININE 0.84 03/03/2021   BUN 23 03/03/2021   NA 142 03/03/2021   K 4.4 03/03/2021   CL 101 03/03/2021   CO2 27 03/03/2021   Lab Results  Component Value Date   ALT 19 03/03/2021   AST 18 03/03/2021   ALKPHOS 86 03/03/2021   BILITOT 0.4 03/03/2021   Lab Results  Component Value Date   HGBA1C 5.5 07/15/2021   HGBA1C 6.1 (H) 03/03/2021   Lab Results  Component Value Date   INSULIN 8.5 03/03/2021   Lab Results  Component Value Date   TSH 1.630 03/03/2021   Lab Results  Component Value Date   CHOL 225 (H) 03/03/2021   HDL 72 03/03/2021   LDLCALC 136 (H) 03/03/2021   TRIG 95 03/03/2021   CHOLHDL 3.0 01/11/2018   Lab Results  Component Value Date   VD25OH 65.6 03/03/2021   VD25OH 53.3 02/23/2017   VD25OH 53 01/13/2016   Lab Results  Component Value Date   WBC 9.2 03/03/2021   HGB 14.7 03/03/2021   HCT 46.2 03/03/2021   MCV 90 03/03/2021   PLT 285 03/03/2021    Attestation Statements:   Reviewed by clinician on day of visit: allergies, medications, problem list, medical history, surgical history, family history, social history, and previous encounter notes.  05/04/2021, CMA, am acting as transcriptionist for Edmund Hilda, DO.  I have reviewed the above documentation for accuracy and completeness, and I agree with the above. [employer], D.O.  The 21st Century Cures Act was signed into law in 2016 which includes the topic of electronic health records.  This provides immediate access to information in MyChart.  This includes consultation notes, operative notes, office notes, lab results and pathology reports.  If you have any questions about what you read please let 2017 know at your next visit so we can discuss your concerns and take corrective action if need be.  We are  right here with you.

## 2021-09-24 ENCOUNTER — Encounter (INDEPENDENT_AMBULATORY_CARE_PROVIDER_SITE_OTHER): Payer: Self-pay | Admitting: Family Medicine

## 2021-09-24 ENCOUNTER — Other Ambulatory Visit: Payer: Self-pay

## 2021-09-24 ENCOUNTER — Ambulatory Visit (INDEPENDENT_AMBULATORY_CARE_PROVIDER_SITE_OTHER): Payer: Managed Care, Other (non HMO) | Admitting: Family Medicine

## 2021-09-24 VITALS — BP 95/57 | Temp 98.0°F | Ht 67.0 in | Wt 194.0 lb

## 2021-09-24 DIAGNOSIS — Z6830 Body mass index (BMI) 30.0-30.9, adult: Secondary | ICD-10-CM

## 2021-09-24 DIAGNOSIS — E669 Obesity, unspecified: Secondary | ICD-10-CM | POA: Diagnosis not present

## 2021-09-24 DIAGNOSIS — J3089 Other allergic rhinitis: Secondary | ICD-10-CM | POA: Diagnosis not present

## 2021-09-24 MED ORDER — LEVOCETIRIZINE DIHYDROCHLORIDE 5 MG PO TABS: 5.0000 mg | ORAL_TABLET | Freq: Every evening | ORAL | 0 refills | Status: DC

## 2021-09-28 NOTE — Progress Notes (Signed)
Chief Complaint:   OBESITY Katherine Adams is here to discuss her progress with her obesity treatment plan along with follow-up of her obesity related diagnoses. Katherine Adams is on the Category 1 Plan or keeping a food journal and adhering to recommended goals of 1000-1100 calories and 80+ grams of protein daily and states she is following her eating plan approximately 85% of the time. Katherine Adams states she is walking for 20 minutes 2-3 times per week.  Today's visit was #: 12 Starting weight: 218 lbs Starting date: 03/03/2021 Today's weight: 194 lbs Today's date: 09/24/2021 Total lbs lost to date: 24 Total lbs lost since last in-office visit: 2  Interim History: Katherine Adams's dog has health conditions which kept her up all nights. She notes it has been tough to make good decisions with food, but she increased her protein and water intake to offset being more consistent with calorie intake and trying to hit her protein goal.  Subjective:   1. Environmental and seasonal allergies Katherine Adams has seasonal allergies that have been stable, and she has no issues or concerns.    Assessment/Plan:  No orders of the defined types were placed in this encounter.   Medications Discontinued During This Encounter  Medication Reason   levocetirizine (XYZAL) 5 MG tablet Reorder     Meds ordered this encounter  Medications   levocetirizine (XYZAL) 5 MG tablet    Sig: Take 1 tablet (5 mg total) by mouth every evening.    Dispense:  30 tablet    Refill:  0    30 d supply;  ** OV for RF **   Do not send RF request     1. Environmental and seasonal allergies Katherine Adams will continue Xyzal 5 mg, and we will refill for 1 month.  Counseling: - Seasonal and environmental allergies and pathophysiology of disease process discussed with patient.  - Preventative strategies as first line for management discussed.  I encouraged use of KN or N-95 mask use as well as sterile saline rinses such as Katherine Adams Med or AYR sinus rinses to be done twice  daily and after any prolonged exposure to the environment or allergen.    It is best to use distilled water or previously boiled water    - If eyes are itchy or irritated feeling when your seasonal allergies get bad, ok to use Naphcon-A over-the-counter eyedrops as needed - If continues to worsen despite preventative strategies, take over-the-counter meds such as Allegra, Xyzal etc daily during allergy seasons or nasal steroids such as Flonase, Rhinocort and the like - We can consider Astelin nasal spray, if symptoms are not well controlled with OTC medications, nasal rinses and preventative strategies. - Encouraged to return to clinic or call the office today discussed further questions or concerns they may have.  - levocetirizine (XYZAL) 5 MG tablet; Take 1 tablet (5 mg total) by mouth every evening.  Dispense: 30 tablet; Refill: 0  2. Obesity with current BMI of 30.4 Katherine Adams is currently in the action stage of change. As such, her goal is to continue with weight loss efforts. She has agreed to the Category 1 Plan or keeping a food journal and adhering to recommended goals of 1000-1100 calories and 80+ grams of protein daily.   Exercise goals: 20 minutes 3 days per week of moderate intensity aerobic activity.  Behavioral modification strategies: keeping a strict food journal.  Katherine Adams has agreed to follow-up with our clinic in 2 to 3 weeks. She was informed of  the importance of frequent follow-up visits to maximize her success with intensive lifestyle modifications for her multiple health conditions.   Objective:   Blood pressure (!) 95/57, temperature 98 F (36.7 C), height 5\' 7"  (1.702 m), weight 194 lb (88 kg), last menstrual period 12/21/2013. Body mass index is 30.38 kg/m.  General: Cooperative, alert, well developed, in no acute distress. HEENT: Conjunctivae and lids unremarkable. Cardiovascular: Regular rhythm.  Lungs: Normal work of breathing. Neurologic: No focal deficits.   Lab  Results  Component Value Date   CREATININE 0.84 03/03/2021   BUN 23 03/03/2021   NA 142 03/03/2021   K 4.4 03/03/2021   CL 101 03/03/2021   CO2 27 03/03/2021   Lab Results  Component Value Date   ALT 19 03/03/2021   AST 18 03/03/2021   ALKPHOS 86 03/03/2021   BILITOT 0.4 03/03/2021   Lab Results  Component Value Date   HGBA1C 5.5 07/15/2021   HGBA1C 6.1 (H) 03/03/2021   Lab Results  Component Value Date   INSULIN 8.5 03/03/2021   Lab Results  Component Value Date   TSH 1.630 03/03/2021   Lab Results  Component Value Date   CHOL 225 (H) 03/03/2021   HDL 72 03/03/2021   LDLCALC 136 (H) 03/03/2021   TRIG 95 03/03/2021   CHOLHDL 3.0 01/11/2018   Lab Results  Component Value Date   VD25OH 65.6 03/03/2021   VD25OH 53.3 02/23/2017   VD25OH 53 01/13/2016   Lab Results  Component Value Date   WBC 9.2 03/03/2021   HGB 14.7 03/03/2021   HCT 46.2 03/03/2021   MCV 90 03/03/2021   PLT 285 03/03/2021   No results found for: IRON, TIBC, FERRITIN  Attestation Statements:   Reviewed by clinician on day of visit: allergies, medications, problem list, medical history, surgical history, family history, social history, and previous encounter notes.   05/04/2021, am acting as transcriptionist for Trude Mcburney, DO.  I have reviewed the above documentation for accuracy and completeness, and I agree with the above. [employer], D.O.  The 21st Century Cures Act was signed into law in 2016 which includes the topic of electronic health records.  This provides immediate access to information in MyChart.  This includes consultation notes, operative notes, office notes, lab results and pathology reports.  If you have any questions about what you read please let 2017 know at your next visit so we can discuss your concerns and take corrective action if need be.  We are right here with you.

## 2021-10-19 ENCOUNTER — Ambulatory Visit (INDEPENDENT_AMBULATORY_CARE_PROVIDER_SITE_OTHER): Payer: Managed Care, Other (non HMO) | Admitting: Family Medicine

## 2021-11-12 ENCOUNTER — Other Ambulatory Visit: Payer: Self-pay

## 2021-11-12 ENCOUNTER — Ambulatory Visit (INDEPENDENT_AMBULATORY_CARE_PROVIDER_SITE_OTHER): Payer: Managed Care, Other (non HMO) | Admitting: Family Medicine

## 2021-11-12 ENCOUNTER — Encounter (INDEPENDENT_AMBULATORY_CARE_PROVIDER_SITE_OTHER): Payer: Self-pay | Admitting: Family Medicine

## 2021-11-12 VITALS — BP 110/71 | HR 60 | Temp 98.1°F | Ht 67.0 in | Wt 194.0 lb

## 2021-11-12 DIAGNOSIS — E559 Vitamin D deficiency, unspecified: Secondary | ICD-10-CM

## 2021-11-12 DIAGNOSIS — J3089 Other allergic rhinitis: Secondary | ICD-10-CM

## 2021-11-12 DIAGNOSIS — Z6830 Body mass index (BMI) 30.0-30.9, adult: Secondary | ICD-10-CM

## 2021-11-12 DIAGNOSIS — R7303 Prediabetes: Secondary | ICD-10-CM | POA: Diagnosis not present

## 2021-11-12 DIAGNOSIS — E7849 Other hyperlipidemia: Secondary | ICD-10-CM

## 2021-11-12 DIAGNOSIS — E669 Obesity, unspecified: Secondary | ICD-10-CM

## 2021-11-12 DIAGNOSIS — F5089 Other specified eating disorder: Secondary | ICD-10-CM

## 2021-11-12 MED ORDER — LEVOCETIRIZINE DIHYDROCHLORIDE 5 MG PO TABS: 5.0000 mg | ORAL_TABLET | Freq: Every evening | ORAL | 0 refills | Status: DC
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
## 2021-11-13 LAB — COMPREHENSIVE METABOLIC PANEL
ALT: 18 IU/L (ref 0–32)
AST: 21 IU/L (ref 0–40)
Albumin/Globulin Ratio: 1.9 (ref 1.2–2.2)
Albumin: 4.6 g/dL (ref 3.8–4.9)
Alkaline Phosphatase: 75 IU/L (ref 44–121)
BUN/Creatinine Ratio: 24 (ref 12–28)
BUN: 20 mg/dL (ref 8–27)
Bilirubin Total: 0.4 mg/dL (ref 0.0–1.2)
CO2: 24 mmol/L (ref 20–29)
Calcium: 9.8 mg/dL (ref 8.7–10.3)
Chloride: 101 mmol/L (ref 96–106)
Creatinine, Ser: 0.83 mg/dL (ref 0.57–1.00)
Globulin, Total: 2.4 g/dL (ref 1.5–4.5)
Glucose: 77 mg/dL (ref 70–99)
Potassium: 4.4 mmol/L (ref 3.5–5.2)
Sodium: 139 mmol/L (ref 134–144)
Total Protein: 7 g/dL (ref 6.0–8.5)
eGFR: 81 mL/min/{1.73_m2} (ref >59)

## 2021-11-13 LAB — LIPID PANEL
Chol/HDL Ratio: 3.1 ratio (ref 0.0–4.4)
Cholesterol, Total: 236 mg/dL — ABNORMAL HIGH (ref 100–199)
HDL: 76 mg/dL (ref >39)
LDL Chol Calc (NIH): 151 mg/dL — ABNORMAL HIGH (ref 0–99)
Triglycerides: 55 mg/dL (ref 0–149)
VLDL Cholesterol Cal: 9 mg/dL (ref 5–40)

## 2021-11-13 LAB — HEMOGLOBIN A1C
Est. average glucose Bld gHb Est-mCnc: 114 mg/dL
Hgb A1c MFr Bld: 5.6 % (ref 4.8–5.6)

## 2021-11-13 LAB — INSULIN, RANDOM: INSULIN: 4.7 u[IU]/mL (ref 2.6–24.9)

## 2021-11-13 LAB — VITAMIN D 25 HYDROXY (VIT D DEFICIENCY, FRACTURES): Vit D, 25-Hydroxy: 57.9 ng/mL (ref 30.0–100.0)

## 2021-11-17 NOTE — Progress Notes (Signed)
? ? ? ?Chief Complaint:  ? ?OBESITY ?Katherine Adams is here to discuss her progress with her obesity treatment plan along with follow-up of her obesity related diagnoses. Katherine Adams is on the Category 1 Plan or keeping a food journal and adhering to recommended goals of 1000-1100 calories and 80+ grams of protein and states she is following her eating plan approximately 50% of the time. Katherine Adams states she is not exercising regularly at this time. ? ?Today's visit was #: 13 ?Starting weight: 218 lbs ?Starting date: 03/03/2021 ?Today's weight: 194 lbs ?Today's date: 11/12/2021 ?Total lbs lost to date: 24 lbs ?Total lbs lost since last in-office visit: 0 ? ?Interim History: Katherine Adams has been tracking and has not been as diligent with getting in her protein grams.  Occasionally skips the dinner meal.  She feels she may be doing more emotional eating lately. ? ?Subjective:  ? ?1. Prediabetes ?Katherine Adams has a diagnosis of prediabetes based on her elevated HgA1c and was informed this puts her at greater risk of developing diabetes. She continues to work on diet and exercise to decrease her risk of diabetes. She denies nausea or hypoglycemia.  She is not on any medications. ? ?2. Vitamin D deficiency ?Katherine Adams is taking OTC vitamin D3 2000 IU daily. ? ?3. Environmental and seasonal allergies ?Katherine Adams ran out of Xyzal and feels that Zyrtec does not work as well. ? ?4. Other hyperlipidemia ?Katherine Adams has hyperlipidemia and has been trying to improve her cholesterol levels with intensive lifestyle modifications including a low saturated fat diet, exercise, and weight loss. She denies any chest pain, claudication, or myalgias.  She is not taking any mediations for this. ? ?5. Other specified eating disorder with emotional eating ?Stress linked eating.  Still taking Prozac 10 mg daily.  Declines need for dose change. ? ?6. At risk for depression ?Katherine Adams is at elevated risk of depression due to one or more of the following: family history, significant life stressors, medical  conditions and/or poor nutrition. ? ?Assessment/Plan:  ? ?Orders Placed This Encounter  ?Procedures  ? VITAMIN D 25 Hydroxy (Vit-D Deficiency, Fractures)  ? Lipid panel  ? Comprehensive metabolic panel  ? Hemoglobin A1c  ? Insulin, random  ? ? ?Medications Discontinued During This Encounter  ?Medication Reason  ? levocetirizine (XYZAL) 5 MG tablet Reorder  ?  ? ?Meds ordered this encounter  ?Medications  ? levocetirizine (XYZAL) 5 MG tablet  ?  Sig: Take 1 tablet (5 mg total) by mouth every evening.  ?  Dispense:  30 tablet  ?  Refill:  0  ?  30 d supply;  ** OV for RF **   Do not send RF request  ?  ? ?1. Prediabetes ?Ciannah will continue to work on weight loss, exercise, and decreasing simple carbohydrates to help decrease the risk of diabetes. Continue prudent nutritional plan.  Check A1c and fasting insulin level today. ? ?- Hemoglobin A1c ?- Insulin, random ? ?2. Vitamin D deficiency ?Low Vitamin D level contributes to fatigue and are associated with obesity, breast, and colon cancer. She agrees to continue to take OTC vitamin D3 2000 IU daily and we will check her vitamin D level today. ? ?- VITAMIN D 25 Hydroxy (Vit-D Deficiency, Fractures) ? ?3. Environmental and seasonal allergies ?Continue Patanol as needed.  Will refill Xyxal 5 mg daily today. ? ?- Refill levocetirizine (XYZAL) 5 MG tablet; Take 1 tablet (5 mg total) by mouth every evening.  Dispense: 30 tablet; Refill: 0 ? ?4. Other hyperlipidemia ?  Cardiovascular risk and specific lipid/LDL goals reviewed.  We discussed several lifestyle modifications today and Vaughn will continue to work on diet, exercise and weight loss efforts. Will check FLP and CMP today.  ? ?Counseling ?Intensive lifestyle modifications are the first line treatment for this issue. ?Dietary changes: Increase soluble fiber. Decrease simple carbohydrates. ?Exercise changes: Moderate to vigorous-intensity aerobic activity 150 minutes per week if tolerated. ?Lipid-lowering medications: see  documented in medical record. ? ?- Lipid panel ?- Comprehensive metabolic panel ? ?5. Other specified eating disorder with emotional eating ?Patient was referred to Dr. Mallie Mussel, our Bariatric Psychologist, for evaluation due to her elevated PHQ-9 score and significant struggles with emotional eating.  Continue Prozac for now.  Increase exercise. ? ?6. At risk for depression ?Katherine Adams was given approximately 9 minutes of depression risk counseling today.  We discussed the importance of a healthy work life balance, a healthy relationship with food and a good support system. ? ?Repetitive spaced learning was employed today to elicit superior memory formation and behavioral change. ? ?7. Obesity with current BMI of 30.4 ? ?Katherine Adams is currently in the action stage of change. As such, her goal is to continue with weight loss efforts. She has agreed to the Category 1 Plan or keeping a food journal and adhering to recommended goals of 1000-1100 calories and 80+ grams of protein.  ? ?Exercise goals: All adults should avoid inactivity. Some physical activity is better than none, and adults who participate in any amount of physical activity gain some health benefits. ? ?Behavioral modification strategies: avoiding temptations, planning for success, and keeping a strict food journal. ? ?Katherine Adams has agreed to follow-up with our clinic in 3 weeks. She was informed of the importance of frequent follow-up visits to maximize her success with intensive lifestyle modifications for her multiple health conditions.  ? ?Katherine Adams was informed we would discuss her lab results at her next visit unless there is a critical issue that needs to be addressed sooner. Katherine Adams agreed to keep her next visit at the agreed upon time to discuss these results. ? ?Objective:  ? ?Blood pressure 110/71, pulse 60, temperature 98.1 ?F (36.7 ?C), height 5\' 7"  (1.702 m), weight 194 lb (88 kg), last menstrual period 12/21/2013, SpO2 97 %. ?Body mass index is 30.38 kg/m?. ? ?General:  Cooperative, alert, well developed, in no acute distress. ?HEENT: Conjunctivae and lids unremarkable. ?Cardiovascular: Regular rhythm.  ?Lungs: Normal work of breathing. ?Neurologic: No focal deficits.  ? ?Lab Results  ?Component Value Date  ? CREATININE 0.83 11/12/2021  ? BUN 20 11/12/2021  ? NA 139 11/12/2021  ? K 4.4 11/12/2021  ? CL 101 11/12/2021  ? CO2 24 11/12/2021  ? ?Lab Results  ?Component Value Date  ? ALT 18 11/12/2021  ? AST 21 11/12/2021  ? ALKPHOS 75 11/12/2021  ? BILITOT 0.4 11/12/2021  ? ?Lab Results  ?Component Value Date  ? HGBA1C 5.6 11/12/2021  ? HGBA1C 5.5 07/15/2021  ? HGBA1C 6.1 (H) 03/03/2021  ? ?Lab Results  ?Component Value Date  ? INSULIN 4.7 11/12/2021  ? INSULIN 8.5 03/03/2021  ? ?Lab Results  ?Component Value Date  ? TSH 1.630 03/03/2021  ? ?Lab Results  ?Component Value Date  ? CHOL 236 (H) 11/12/2021  ? HDL 76 11/12/2021  ? LDLCALC 151 (H) 11/12/2021  ? TRIG 55 11/12/2021  ? CHOLHDL 3.1 11/12/2021  ? ?Lab Results  ?Component Value Date  ? VD25OH 57.9 11/12/2021  ? VD25OH 65.6 03/03/2021  ? VD25OH 53.3 02/23/2017  ? ?  Lab Results  ?Component Value Date  ? WBC 9.2 03/03/2021  ? HGB 14.7 03/03/2021  ? HCT 46.2 03/03/2021  ? MCV 90 03/03/2021  ? PLT 285 03/03/2021  ? ?Attestation Statements:  ? ?Reviewed by clinician on day of visit: allergies, medications, problem list, medical history, surgical history, family history, social history, and previous encounter notes. ? ?I, Water quality scientist, CMA, am acting as transcriptionist for [employer], DO. ? ?I have reviewed the above documentation for accuracy and completeness, and I agree with the above. Marjory Sneddon, D.O. ? ?The [place] was signed into law in 2016 which includes the topic of electronic health records.  This provides immediate access to information in MyChart.  This includes consultation notes, operative notes, office notes, lab results and pathology reports.  If you have any questions about what you read  please let us know at your next visit so we can discuss your concerns and take corrective action if need be.  We are right here with you. ? ?

## 2021-11-24 ENCOUNTER — Telehealth (INDEPENDENT_AMBULATORY_CARE_PROVIDER_SITE_OTHER): Payer: 59 | Admitting: Psychology

## 2021-11-24 DIAGNOSIS — F5089 Other specified eating disorder: Secondary | ICD-10-CM

## 2021-11-24 DIAGNOSIS — F4320 Adjustment disorder, unspecified: Secondary | ICD-10-CM | POA: Diagnosis not present

## 2021-11-24 NOTE — Progress Notes (Signed)
?  Office: [phone]  /  Fax: [phone] ? ? ? ?Date: 12/08/2021   ?Appointment Start Time: 4:33pm ?Duration: 29 minutes ?Provider: Glennie Isle, Psy.D. ?Type of Session: Individual Therapy  ?Location of Patient: Work (private location) ?Location of Provider: Provider's Home (private office) ?Type of Contact: Telepsychological Visit via MyChart Video Visit ? ?Session Content: Katherine Adams is a 61 y.o. female presenting for a follow-up appointment to address the previously established treatment goal of increasing coping skills.Today's appointment was a telepsychological visit due to COVID-19. Katherine Adams provided verbal consent for today's telepsychological appointment and she is aware she is responsible for securing confidentiality on her end of the session. Prior to proceeding with today's appointment, Katherine Adams's physical location at the time of this appointment was obtained as well a phone number she could be reached at in the event of technical difficulties. Katherine Adams and this provider participated in today's telepsychological service.  ? ?This provider conducted a brief check-in. Katherine Adams shared, "It's been a rough couple of days," noting her dog recently experienced seizures. Regarding eating habits, she acknowledged deviations due to the recent holiday. Of note, Katherine Adams stated she was unable to open the previously shared handouts; therefore, they were re-sent. Psychoeducation regarding triggers for emotional eating was provided. Katherine Adams was provided a handout, and encouraged to utilize the handout between now and the next appointment to increase awareness of triggers and frequency. Katherine Adams agreed. This provider also discussed behavioral strategies for specific triggers, such as placing the utensil down when conversing to avoid mindless eating. Katherine Adams provided verbal consent during today's appointment for this provider to send a handout about triggers via e-mail. Overall, Katherine Adams was receptive to today's appointment as evidenced by openness to sharing,  responsiveness to feedback, and willingness to explore triggers for emotional eating. ? ?Mental Status Examination:  ?Appearance: neat ?Behavior: appropriate to circumstances ?Mood: neutral ?Affect: mood congruent ?Speech: WNL ?Eye Contact: appropriate ?Psychomotor Activity: WNL ?Gait: unable to assess ?Thought Process: linear, logical, and goal directed and no evidence or endorsement of suicidal, homicidal, and self-harm ideation, plan and intent  ?Thought Content/Perception: no hallucinations, delusions, bizarre thinking or behavior endorsed or observed ?Orientation: AAOx4 ?Memory/Concentration: memory, attention, language, and fund of knowledge intact  ?Insight: fair ?Judgment: fair ? ?Interventions:  ?Conducted a brief chart review ?Provided empathic reflections and validation ?Employed supportive psychotherapy interventions to facilitate reduced distress and to improve coping skills with identified stressors ?Psychoeducation provided regarding triggers for emotional eating behaviors ? 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 ?Treatment Goal & Progress: During the initial appointment with this provider, the following treatment goal was established: increase coping skills. Progress is limited, as Tarianna has just begun treatment with this provider; however, she is receptive to the interaction and interventions and rapport is being established.  ? ?Plan: Based on appointment availability and Katherine Adams's schedule,the next appointment will be scheduled in three weeks, which will be via Katherine Adams Visit. The next session will focus on working towards the established treatment goal. ? ?

## 2021-12-08 ENCOUNTER — Telehealth (INDEPENDENT_AMBULATORY_CARE_PROVIDER_SITE_OTHER): Payer: 59 | Admitting: Psychology

## 2021-12-08 DIAGNOSIS — F4320 Adjustment disorder, unspecified: Secondary | ICD-10-CM | POA: Diagnosis not present

## 2021-12-08 DIAGNOSIS — F5089 Other specified eating disorder: Secondary | ICD-10-CM | POA: Diagnosis not present

## 2021-12-10 ENCOUNTER — Encounter (INDEPENDENT_AMBULATORY_CARE_PROVIDER_SITE_OTHER): Payer: Self-pay | Admitting: Family Medicine

## 2021-12-10 ENCOUNTER — Ambulatory Visit (INDEPENDENT_AMBULATORY_CARE_PROVIDER_SITE_OTHER): Payer: Managed Care, Other (non HMO) | Admitting: Family Medicine

## 2021-12-10 VITALS — BP 99/65 | HR 57 | Temp 97.8°F | Ht 67.0 in | Wt 196.0 lb

## 2021-12-10 DIAGNOSIS — J3089 Other allergic rhinitis: Secondary | ICD-10-CM

## 2021-12-10 DIAGNOSIS — E7849 Other hyperlipidemia: Secondary | ICD-10-CM

## 2021-12-10 DIAGNOSIS — R7303 Prediabetes: Secondary | ICD-10-CM | POA: Diagnosis not present

## 2021-12-10 DIAGNOSIS — Z6834 Body mass index (BMI) 34.0-34.9, adult: Secondary | ICD-10-CM

## 2021-12-10 DIAGNOSIS — E669 Obesity, unspecified: Secondary | ICD-10-CM

## 2021-12-10 DIAGNOSIS — Z9189 Other specified personal risk factors, not elsewhere classified: Secondary | ICD-10-CM

## 2021-12-10 DIAGNOSIS — E559 Vitamin D deficiency, unspecified: Secondary | ICD-10-CM

## 2021-12-10 DIAGNOSIS — Z6830 Body mass index (BMI) 30.0-30.9, adult: Secondary | ICD-10-CM

## 2021-12-10 MED ORDER — LEVOCETIRIZINE DIHYDROCHLORIDE 5 MG PO TABS: 5.0000 mg | ORAL_TABLET | Freq: Every evening | ORAL | 0 refills | Status: DC

## 2021-12-10 NOTE — Patient Instructions (Signed)
The 10-year ASCVD risk score (Arnett DK, et al., 2019) is: 3.9% ?  Values used to calculate the score: ?    Age: 61 years ?    Sex: Female ?    Is Non-Hispanic African American: No ?    Diabetic: No ?    Tobacco smoker: Yes ?    Systolic Blood Pressure: 99 mmHg ?    Is BP treated: No ?    HDL Cholesterol: 76 mg/dL ?    Total Cholesterol: 236 mg/dL ? ?

## 2021-12-15 NOTE — Progress Notes (Signed)
?  Office: [phone]  /  Fax: [phone] ? ? ? ?Date: 12/29/2021   ?Appointment Start Time: 4:33pm ?Duration: 26 minutes ?Provider: Lawerance Cruel, Psy.D. ?Type of Session: Individual Therapy  ?Location of Patient: Work (private location) ?Location of Provider: Provider's Home (private office) ?Type of Contact: Telepsychological Visit via MyChart Video Visit ? ?Session Content: Katherine Adams is a 61 y.o. female presenting for a follow-up appointment to address the previously established treatment goal of increasing coping skills.Today's appointment was a telepsychological visit due to COVID-19. [name] provided verbal consent for today's telepsychological appointment and she is aware she is responsible for securing confidentiality on her end of the session. Prior to proceeding with today's appointment, [name]'s physical location at the time of this appointment was obtained as well a phone number she could be reached at in the event of technical difficulties. [name] and this provider participated in today's telepsychological service.  ? ?This provider conducted a brief check-in. [name] shared about recent events, noting her sleep is impacted by her dog's well-being. Psychoeducation regarding the importance of self-care utilizing the oxygen mask metaphor was provided. Psychoeducation regarding pleasurable activities, including its impact on emotional eating and overall well-being was also provided. [name] was provided with a handout with various options of pleasurable activities, and was encouraged to engage in one activity a day and additional activities as needed when triggered to emotionally eat. [name] agreed. [name] provided verbal consent during today's appointment for this provider to send a handout with pleasurable activities via e-mail. Overall, [name] was receptive to today's appointment as evidenced by openness to sharing, responsiveness to feedback, and willingness to engage in pleasurable activities to assist with coping. ? ?Mental Status  Examination:  ?Appearance: neat ?Behavior: appropriate to circumstances ?Mood: sad ?Affect: mood congruent ?Speech: WNL ?Eye Contact: appropriate ?Psychomotor Activity: WNL ?Gait: unable to assess ?Thought Process: linear, logical, and goal directed and no evidence or endorsement of suicidal, homicidal, and self-harm ideation, plan and intent  ?Thought Content/Perception: no hallucinations, delusions, bizarre thinking or behavior endorsed or observed ?Orientation: AAOx4 ?Memory/Concentration: memory, attention, language, and fund of knowledge intact  ?Insight: fair ?Judgment: fair ? ?Interventions:  ?Conducted a brief chart review ?Provided empathic reflections and validation ?Employed supportive psychotherapy interventions to facilitate reduced distress and to improve coping skills with identified stressors ?Psychoeducation provided regarding pleasurable activities ?Psychoeducation provided regarding self-care ? 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 ?Treatment Goal & Progress: During the initial appointment with this provider, the following treatment goal was established: increase coping skills. [name] has demonstrated progress in her goal as evidenced by increased awareness of hunger patterns and increased awareness of triggers for emotional eating behaviors. [name] also demonstrates willingness to engage in pleasurable activities. ? ?Plan: Based on [name]'s schedule, the next appointment is scheduled for  01/26/2022 at 4:30pm, which will be via MyChart Video Visit. The next session will focus on working towards the established treatment goal. ? ?

## 2021-12-23 NOTE — Progress Notes (Signed)
? ? ? ?Chief Complaint:  ? ?OBESITY ?Chivon is here to discuss her progress with her obesity treatment plan along with follow-up of her obesity related diagnoses. Lorisa is on the Category 1 Plan and keeping a food journal and adhering to recommended goals of 1000-1100 calories and 80+ grams of protein daily and states she is following her eating plan approximately 20% of the time. Cheryel states she is walking for 10-20 minutes 1 time per week. ? ?Today's visit was #: 14 ?Starting weight: 218 lbs ?Starting date: 03/03/2021 ?Today's weight: 196 lbs ?Today's date: 12/10/2021 ?Total lbs lost to date: 22 ?Total lbs lost since last in-office visit: 0 ? ?Interim History: Trenise is struggling a little lately, and working with Dr. Dewaine Conger who she recently saw. Snacking more and not sure why. Been upset as 27 year old dog is feeling with her health as well. Here to review lab results drawn last office visit with [place]. ? ?Subjective:  ? ?1. Pre-diabetes ?Jyra's last A1c was 5.6, within normal limits currently. Fasting insulin now within normal limits. I discussed labs with the patient today.  ? ?2. Other hyperlipidemia ?Arnetra's HDL and LDL are elevated. I discussed labs with the patient today.  ? ?3. Vitamin D deficiency ?Donabelle is currently taking OTC vitamin D 2,000 IU each day. She denies nausea, vomiting or muscle weakness. ? ?4. Environmental and seasonal allergies ?Iraida Cragin is tolerating medication(s) well without side effects.  Medication compliance is good as patient endorses taking it as prescribed. Her symptoms are stable. The patient denies additional concerns regarding this condition.     ? ?5. At risk for heart disease ?Kewanna is at higher than average risk for cardiovascular disease due to elevated LDL. ? ?Assessment/Plan:  ?No orders of the defined types were placed in this encounter. ? ? ?Medications Discontinued During This Encounter  ?Medication Reason  ? levocetirizine (XYZAL) 5 MG tablet Reorder  ?  ? ?Meds ordered this  encounter  ?Medications  ? levocetirizine (XYZAL) 5 MG tablet  ?  Sig: Take 1 tablet (5 mg total) by mouth every evening.  ?  Dispense:  30 tablet  ?  Refill:  0  ?  30 d supply;  ** OV for RF **   Do not send RF request  ?  ? ?1. Pre-diabetes ?Darren will decrease simple carbohydrates, and continue with weight loss and improving labs overall.  ? ?2. Other hyperlipidemia ?Zuria is to decrease her egg ingestion by substituting 1 egg with 4 egg whites and decrease red meat intake to 2 days per week. She is to avoid butter and mayo etc. She will follow up with her PCP as discussed.  ? ?3. Vitamin D deficiency ?Nani's Vitamin D level is at goal. She will continue OTC Vitamin D and will follow-up for routine testing of Vitamin D, at least 2-3 times per year to avoid over-replacement. ? ?4. Environmental and seasonal allergies ?Nakeitha will continue Xyzal, and we will refill for 1 month per patient's request. She will continue with preventative strategies as well. ? ?- levocetirizine (XYZAL) 5 MG tablet; Take 1 tablet (5 mg total) by mouth every evening.  Dispense: 30 tablet; Refill: 0 ? ?5. At risk for heart disease ?Phoenyx was given approximately 15 minutes of coronary artery disease prevention counseling today. She is 61 y.o. female and has risk factors for heart disease including obesity. We discussed intensive lifestyle modifications today with an emphasis on specific weight loss instructions and strategies. ? ?Repetitive spaced  learning was employed today to elicit superior memory formation and behavioral change.  ? ?6. Obesity with current BMI of 30.7 ?Raylin is currently in the action stage of change. As such, her goal is to continue with weight loss efforts. She has agreed to the Category 1 Plan and keeping a food journal and adhering to recommended goals of 1000-1100 calories and 80+ grams of protein daily.  ? ?Start walking for 10 minutes 3 days per week. Mindful eating handout was given.  ? ?Exercise goals: All adults should  avoid inactivity. Some physical activity is better than none, and adults who participate in any amount of physical activity gain some health benefits. ? ?Behavioral modification strategies: emotional eating strategies, planning for success, and keeping a strict food journal. ? ?Kamorie has agreed to follow-up with our clinic in 3 to 4 weeks. She was informed of the importance of frequent follow-up visits to maximize her success with intensive lifestyle modifications for her multiple health conditions.  ? ?Objective:  ? ?Blood pressure 99/65, pulse (!) 57, temperature 97.8 ?F (36.6 ?C), height 5\' 7"  (1.702 m), weight 196 lb (88.9 kg), last menstrual period 12/21/2013, SpO2 99 %. ?Body mass index is 30.7 kg/m?. ? ?General: Cooperative, alert, well developed, in no acute distress. ?HEENT: Conjunctivae and lids unremarkable. ?Cardiovascular: Regular rhythm.  ?Lungs: Normal work of breathing. ?Neurologic: No focal deficits.  ? ?Lab Results  ?Component Value Date  ? CREATININE 0.83 11/12/2021  ? BUN 20 11/12/2021  ? NA 139 11/12/2021  ? K 4.4 11/12/2021  ? CL 101 11/12/2021  ? CO2 24 11/12/2021  ? ?Lab Results  ?Component Value Date  ? ALT 18 11/12/2021  ? AST 21 11/12/2021  ? ALKPHOS 75 11/12/2021  ? BILITOT 0.4 11/12/2021  ? ?Lab Results  ?Component Value Date  ? HGBA1C 5.6 11/12/2021  ? HGBA1C 5.5 07/15/2021  ? HGBA1C 6.1 (H) 03/03/2021  ? ?Lab Results  ?Component Value Date  ? INSULIN 4.7 11/12/2021  ? INSULIN 8.5 03/03/2021  ? ?Lab Results  ?Component Value Date  ? TSH 1.630 03/03/2021  ? ?Lab Results  ?Component Value Date  ? CHOL 236 (H) 11/12/2021  ? HDL 76 11/12/2021  ? LDLCALC 151 (H) 11/12/2021  ? TRIG 55 11/12/2021  ? CHOLHDL 3.1 11/12/2021  ? ?Lab Results  ?Component Value Date  ? VD25OH 57.9 11/12/2021  ? VD25OH 65.6 03/03/2021  ? VD25OH 53.3 02/23/2017  ? ?Lab Results  ?Component Value Date  ? WBC 9.2 03/03/2021  ? HGB 14.7 03/03/2021  ? HCT 46.2 03/03/2021  ? MCV 90 03/03/2021  ? PLT 285 03/03/2021  ? ?No  results found for: IRON, TIBC, FERRITIN ? ?Attestation Statements:  ? ?Reviewed by clinician on day of visit: allergies, medications, problem list, medical history, surgical history, family history, social history, and previous encounter notes. ? ? ?I, 05/04/2021, am acting as transcriptionist for Burt Knack, DO. ? ?I have reviewed the above documentation for accuracy and completeness, and I agree with the above. [employer], D.O. ? ?The 21st Century Cures Act was signed into law in 2016 which includes the topic of electronic health records.  This provides immediate access to information in MyChart.  This includes consultation notes, operative notes, office notes, lab results and pathology reports.  If you have any questions about what you read please let 2017 know at your next visit so we can discuss your concerns and take corrective action if need be.  We are right here  with you. ? ? ?

## 2021-12-29 ENCOUNTER — Telehealth (INDEPENDENT_AMBULATORY_CARE_PROVIDER_SITE_OTHER): Payer: Managed Care, Other (non HMO) | Admitting: Psychology

## 2021-12-29 DIAGNOSIS — F5089 Other specified eating disorder: Secondary | ICD-10-CM | POA: Diagnosis not present

## 2021-12-29 DIAGNOSIS — F4320 Adjustment disorder, unspecified: Secondary | ICD-10-CM | POA: Diagnosis not present

## 2022-01-05 ENCOUNTER — Encounter (INDEPENDENT_AMBULATORY_CARE_PROVIDER_SITE_OTHER): Payer: Self-pay | Admitting: Family Medicine

## 2022-01-05 ENCOUNTER — Ambulatory Visit (INDEPENDENT_AMBULATORY_CARE_PROVIDER_SITE_OTHER): Payer: Managed Care, Other (non HMO) | Admitting: Family Medicine

## 2022-01-05 VITALS — BP 109/70 | HR 60 | Temp 98.3°F | Ht 67.0 in | Wt 197.0 lb

## 2022-01-05 DIAGNOSIS — J3089 Other allergic rhinitis: Secondary | ICD-10-CM

## 2022-01-05 DIAGNOSIS — R7303 Prediabetes: Secondary | ICD-10-CM | POA: Diagnosis not present

## 2022-01-05 DIAGNOSIS — Z6830 Body mass index (BMI) 30.0-30.9, adult: Secondary | ICD-10-CM

## 2022-01-05 DIAGNOSIS — E669 Obesity, unspecified: Secondary | ICD-10-CM | POA: Diagnosis not present

## 2022-01-05 DIAGNOSIS — F39 Unspecified mood [affective] disorder: Secondary | ICD-10-CM

## 2022-01-05 DIAGNOSIS — Z9189 Other specified personal risk factors, not elsewhere classified: Secondary | ICD-10-CM

## 2022-01-05 MED ORDER — LEVOCETIRIZINE DIHYDROCHLORIDE 5 MG PO TABS: 5.0000 mg | ORAL_TABLET | Freq: Every evening | ORAL | 0 refills | Status: DC

## 2022-01-12 NOTE — Progress Notes (Signed)
  Office: [phone]  /  Fax: [phone]    Date: 01/26/2022   Appointment Start Time: 4:32pm Duration: 19 minutes Provider: Lawerance Cruel, Psy.D. Type of Session: Individual Therapy  Location of Patient: Work (private location) Location of Provider: Provider's Home (private office) Type of Contact: Telepsychological Visit via MyChart Video Visit  Session Content: Katherine Adams is a 61 y.o. female presenting for a follow-up appointment to address the previously established treatment goal of increasing coping skills.Today's appointment was a telepsychological visit due to COVID-19. Lamonda provided verbal consent for today's telepsychological appointment and she is aware she is responsible for securing confidentiality on her end of the session. Prior to proceeding with today's appointment, Katherine Adams's physical location at the time of this appointment was obtained as well a phone number she could be reached at in the event of technical difficulties. Katherine Adams and this provider participated in today's telepsychological service.   This provider conducted a brief check-in. [name] shared about recent events, including a [place] the [place]. Reviewed self-care. She noted, "I'm actually doing better with that." Positive reinforcement was provided. Notably, Katherine Adams described a reduction in emotional eating behaviors. Psychoeducation regarding mindfulness was provided to further assist with overall coping. A handout was provided to [facility] with further information regarding mindfulness, including exercises. This provider also explained the benefit of mindfulness as it relates to emotional eating. Katherine Adams was encouraged to engage in the provided exercises between now and the next appointment with this provider. Katherine Adams agreed. During today's appointment, Katherine Adams was led through a mindfulness exercise involving her senses. Katherine Adams provided verbal consent during today's appointment for this provider to send a handout about mindfulness via e-mail.  Katherine Adams was receptive  to today's appointment as evidenced by openness to sharing, responsiveness to feedback, and willingness to engage in mindfulness exercises to assist with coping.  Mental Status Examination:  Appearance: neat Behavior: appropriate to circumstances Mood: neutral Affect: mood congruent Speech: WNL Eye Contact: appropriate Psychomotor Activity: WNL Gait: unable to assess Thought Process: linear, logical, and goal directed and no evidence or endorsement of suicidal, homicidal, and self-harm ideation, plan and intent  Thought Content/Perception: no hallucinations, delusions, bizarre thinking or behavior endorsed or observed Orientation: AAOx4 Memory/Concentration: memory, attention, language, and fund of knowledge intact  Insight: fair Judgment: fair  Interventions:  Conducted a brief chart review Provided empathic reflections and validation Reviewed content from the previous session Provided positive reinforcement Employed supportive psychotherapy interventions to facilitate reduced distress and to improve coping skills with identified stressors Psychoeducation provided regarding mindfulness Engaged patient in mindfulness exercise(s)  DSM-5 Diagnosis(es):  F50.89 Other Specified Feeding or Eating Disorder, Emotional Eating Behaviors and  F43.20 Adjustment Disorder, Unspecified   Treatment Goal & Progress: During the initial appointment with this provider, the following treatment goal was established: increase coping skills. Katherine Adams has demonstrated progress in her goal as evidenced by increased awareness of hunger patterns, increased awareness of triggers for emotional eating behaviors, and a reduction in emotional eating behaviors. Katherine Adams also continues to demonstrate willingness to engage in learned skill(s).  Plan: The next appointment is scheduled for 02/23/2022 at 4:30pm, which will be via MyChart Video Visit. The next session will focus on working towards the established treatment goal. Per  [employer] new policy, Katherine Adams will complete two additional forms (AOB and Receipt of NPP) when she is at the clinic on 02/03/2022 for her appointment for her appointment with Dr. Sharee Holster.

## 2022-01-17 NOTE — Progress Notes (Unsigned)
Chief Complaint:   OBESITY Katherine Adams is here to discuss her progress with her obesity treatment plan along with follow-up of her obesity related diagnoses. Katherine Adams is on Category 1 Plan and  keeping a food journal and adhering to recommended goals of 1000-1000 calories and 80 grams of protein and states she is following her eating plan approximately 30% of the time. Katherine Adams states she is walking 15-20 minutes 2 times per week.  Today's visit was #: 15 Starting weight: 218 lbs Starting date: 03/03/2021 Today's weight: 197 lbs Today's date: 01/05/2022 Total lbs lost to date: 21 lbs Total lbs lost since last in-office visit: 0 lbs  Interim History: Katherine Adams reports not sleeping well because of their elderly 7 yr old dog who is sick with CHF. She is under a lot of stress mentally caring for her dog and not herself. Katherine Adams has not been journaling. She is stressed.  Subjective:   1. Environmental and seasonal allergies Katherine Adams reports that her symptoms are well controlled on mediations. She is not needing to use the Neti pot.  2. Pre-diabetes Katherine Adams is mot having carb cravings, per se, as she feels her snacking is emotional medicated.  3. Mood disorder (HCC) Katherine Adams is having some depressive symptoms and also sometimes anxiety worrying about her dog. She is taking Prozac and feels mood is stable, but worse because she is not sleeping well.  4. At risk for depression Katherine Adams is struggling with emotional eating and using food for comfort to the extent that it is negatively impacting her health. She has been working on behavior modification techniques to help reduce her emotional eating and has been somewhat successful. She shows no signs of suicidal or homicidal ideations.   Assessment/Plan:  No orders of the defined types were placed in this encounter.   Medications Discontinued During This Encounter  Medication Reason   levocetirizine (XYZAL) 5 MG tablet Reorder     Meds ordered this encounter  Medications    levocetirizine (XYZAL) 5 MG tablet    Sig: Take 1 tablet (5 mg total) by mouth every evening.    Dispense:  30 tablet    Refill:  0    30 d supply;  ** OV for RF **   Do not send RF request     1. Environmental and seasonal allergies Katherine Adams will continue preventative strategies as well as shower after exposure. We will refill Xyzal. - levocetirizine (XYZAL) 5 MG tablet; Take 1 tablet (5 mg total) by mouth every evening.  Dispense: 30 tablet; Refill: 0  2. Pre-diabetes Katherine Adams was provided with the Prediabetes hand out on Metformin and she was educated on the medication. She will continue with her PNP and weight loss. She will consider medication help in the future as needed.  3. Mood disorder (HCC) Katherine Adams will continue her mood medication per her PCP. She will start anxiety reducing breathing 4-7-8 method and walking more.  4. At risk for depression Katherine Adams was given approximately 9 minutes of depression risk counseling today. She has risk factors for depression including she may be losing her dog in the near future. We discussed the importance of a healthy work life balance, a healthy relationship with food and a good support system.  Repetitive spaced learning was employed today to elicit superior memory formation and behavioral change.   5. Obesity, Current BMI 30.9 Katherine Adams is currently in the action stage of change. As such, her goal is to continue with weight loss efforts. She has  agreed to the Category 1 Plan.   Katherine Adams will do 4-7-8 Breathing method; walk 10 minutes 3 days per week. She will stop journaling and follow Category 1 Plan, as she feels this would be easier for her.  Exercise goals: Increase exercise as tolerated to help with stress.  Behavioral modification strategies: increasing lean protein intake, decreasing simple carbohydrates, emotional eating strategies, and avoiding temptations.  Katherine Adams has agreed to follow-up with our clinic in 3-4 weeks. She was informed of the importance of  frequent follow-up visits to maximize her success with intensive lifestyle modifications for her multiple health conditions.   Objective:   Blood pressure 109/70, pulse 60, temperature 98.3 F (36.8 C), height 5\' 7"  (1.702 m), weight 197 lb (89.4 kg), last menstrual period 12/21/2013, SpO2 95 %. Body mass index is 30.85 kg/m.  General: Cooperative, alert, well developed, in no acute distress. HEENT: Conjunctivae and lids unremarkable. Cardiovascular: Regular rhythm.  Lungs: Normal work of breathing. Neurologic: No focal deficits.   Lab Results  Component Value Date   CREATININE 0.83 11/12/2021   BUN 20 11/12/2021   NA 139 11/12/2021   K 4.4 11/12/2021   CL 101 11/12/2021   CO2 24 11/12/2021   Lab Results  Component Value Date   ALT 18 11/12/2021   AST 21 11/12/2021   ALKPHOS 75 11/12/2021   BILITOT 0.4 11/12/2021   Lab Results  Component Value Date   HGBA1C 5.6 11/12/2021   HGBA1C 5.5 07/15/2021   HGBA1C 6.1 (H) 03/03/2021   Lab Results  Component Value Date   INSULIN 4.7 11/12/2021   INSULIN 8.5 03/03/2021   Lab Results  Component Value Date   TSH 1.630 03/03/2021   Lab Results  Component Value Date   CHOL 236 (H) 11/12/2021   HDL 76 11/12/2021   LDLCALC 151 (H) 11/12/2021   TRIG 55 11/12/2021   CHOLHDL 3.1 11/12/2021   Lab Results  Component Value Date   VD25OH 57.9 11/12/2021   VD25OH 65.6 03/03/2021   VD25OH 53.3 02/23/2017   Lab Results  Component Value Date   WBC 9.2 03/03/2021   HGB 14.7 03/03/2021   HCT 46.2 03/03/2021   MCV 90 03/03/2021   PLT 285 03/03/2021   No results found for: IRON, TIBC, FERRITIN  Attestation Statements:   Reviewed by clinician on day of visit: allergies, medications, problem list, medical history, surgical history, family history, social history, and previous encounter notes.  I09/12/2020, CMA, am acting as transcriptionist for Dr. Paulla Fore, DO.  I have reviewed the above documentation for accuracy and  completeness, and I agree with the above. Sharee Holster, D.O.  The 21st Century Cures Act was signed into law in 2016 which includes the topic of electronic health records.  This provides immediate access to information in MyChart.  This includes consultation notes, operative notes, office notes, lab results and pathology reports.  If you have any questions about what you read please let 2017 know at your next visit so we can discuss your concerns and take corrective action if need be.  We are right here with you.

## 2022-01-26 ENCOUNTER — Telehealth (INDEPENDENT_AMBULATORY_CARE_PROVIDER_SITE_OTHER): Payer: Managed Care, Other (non HMO) | Admitting: Psychology

## 2022-01-26 DIAGNOSIS — F4320 Adjustment disorder, unspecified: Secondary | ICD-10-CM

## 2022-01-26 DIAGNOSIS — F5089 Other specified eating disorder: Secondary | ICD-10-CM | POA: Diagnosis not present

## 2022-02-01 ENCOUNTER — Ambulatory Visit (INDEPENDENT_AMBULATORY_CARE_PROVIDER_SITE_OTHER): Payer: Managed Care, Other (non HMO) | Admitting: Family Medicine

## 2022-02-03 ENCOUNTER — Encounter (INDEPENDENT_AMBULATORY_CARE_PROVIDER_SITE_OTHER): Payer: Self-pay | Admitting: Family Medicine

## 2022-02-03 ENCOUNTER — Ambulatory Visit (INDEPENDENT_AMBULATORY_CARE_PROVIDER_SITE_OTHER): Payer: Managed Care, Other (non HMO) | Admitting: Family Medicine

## 2022-02-03 VITALS — BP 123/78 | HR 56 | Temp 98.5°F | Ht 67.0 in | Wt 200.0 lb

## 2022-02-03 DIAGNOSIS — E669 Obesity, unspecified: Secondary | ICD-10-CM

## 2022-02-03 DIAGNOSIS — J3089 Other allergic rhinitis: Secondary | ICD-10-CM | POA: Diagnosis not present

## 2022-02-03 DIAGNOSIS — Z9189 Other specified personal risk factors, not elsewhere classified: Secondary | ICD-10-CM

## 2022-02-03 DIAGNOSIS — Z6831 Body mass index (BMI) 31.0-31.9, adult: Secondary | ICD-10-CM

## 2022-02-03 DIAGNOSIS — Z6834 Body mass index (BMI) 34.0-34.9, adult: Secondary | ICD-10-CM

## 2022-02-03 DIAGNOSIS — R7303 Prediabetes: Secondary | ICD-10-CM | POA: Diagnosis not present

## 2022-02-03 DIAGNOSIS — F430 Acute stress reaction: Secondary | ICD-10-CM | POA: Diagnosis not present

## 2022-02-03 MED ORDER — LEVOCETIRIZINE DIHYDROCHLORIDE 5 MG PO TABS: 5.0000 mg | ORAL_TABLET | Freq: Every evening | ORAL | 0 refills | Status: DC

## 2022-02-09 NOTE — Progress Notes (Unsigned)
  Office: [phone]  /  Fax: [phone]    Date: 02/23/2022   Appointment Start Time: *** Duration: *** minutes Provider: Lawerance Cruel, Psy.D. Type of Session: Individual Therapy  Location of Patient: {gbptloc:23249} (private location) Location of Provider: Provider's Home (private office) Type of Contact: Telepsychological Visit via MyChart Video Visit  Session Content: Katherine Adams is a 61 y.o. female presenting for a follow-up appointment to address the previously established treatment goal of increasing coping skills.Today's appointment was a telepsychological visit. [name] provided verbal consent for today's telepsychological appointment and she is aware she is responsible for securing confidentiality on her end of the session. Prior to proceeding with today's appointment, [name]'s physical location at the time of this appointment was obtained as well a phone number she could be reached at in the event of technical difficulties. [name] and this provider participated in today's telepsychological service.   This provider conducted a brief check-in. *** [name] was receptive to today's appointment as evidenced by openness to sharing, responsiveness to feedback, and {gbreceptiveness:23401}.  Mental Status Examination:  Appearance: {Appearance:22431} Behavior: {Behavior:22445} Mood: {gbmood:21757} Affect: {Affect:22436} Speech: {Speech:22432} Eye Contact: {Eye Contact:22433} Psychomotor Activity: {Motor Activity:22434} Gait: {gbgait:23404} Thought Process: {thought process:22448}  Thought Content/Perception: {disturbances:22451} Orientation: {Orientation:22437} Memory/Concentration: {gbcognition:22449} Insight: {Insight:22446} Judgment: {Insight:22446}  Interventions:  {Interventions for Progress Notes:23405}  DSM-5 Diagnosis(es):  F50.89 Other Specified Feeding or Eating Disorder, Emotional Eating Behaviors and  F43.20 Adjustment Disorder, Unspecified   Treatment Goal & Progress: During the  initial appointment with this provider, the following treatment goal was established: increase coping skills. [name] has demonstrated progress in her goal as evidenced by {gbtxprogress:22839}. [name] also {gbtxprogress2:22951}.  Plan: The next appointment is scheduled for *** at ***, which will be via MyChart Video Visit. The next session will focus on {Plan for Next Appointment:23400}.

## 2022-02-11 NOTE — Progress Notes (Unsigned)
Chief Complaint:   OBESITY Katherine Adams is here to discuss her progress with her obesity treatment plan along with follow-up of her obesity related diagnoses. Jacklyne is on the Category 1 Plan and states she is following her eating plan approximately 50% of the time. Lamiya states she is not currently exercising.  Today's visit was #: 16 Starting weight: 218 lbs Starting date: 03/03/2021 Today's weight: 200 lbs Today's date: 02/03/2022 Total lbs lost to date: 18 Total lbs lost since last in-office visit: +3  Interim History: Chloe went to [facility] for a week and ate off plan. She was last seen a month ago. Pt reports getting back on track 3 days ago. We changed her to category 1 from journaling at her last OV and it is going okay. Pt denies issues.  Subjective:   1. Acute reaction to stress Denna's elderly 2 year old dog, Ellie, has CHF and pt is emotional about it. She did di 4-7-8 breathing 2-3 times a day, along with some walking, and she thinks it has helped.   2. Prediabetes She hasn't been following the meal plan as diligently lately, but when she does, pt has no hunger or cravings.  3. Environmental and seasonal allergies Symptoms are well controlled with meds. [place]  4. At risk for depression Merary is at risk for depression due to stressors and illness of family pet.  Assessment/Plan:   1. Acute reaction to stress Pt declines the need for dose change with Prozac. She will continue with exercise, breathing, prudent nutritional plan, and Prozac 10 mg BID per PCP.  2. Prediabetes Madell will continue to work on weight loss, exercise, increasing protein, and decreasing simple carbohydrates to help decrease the risk of diabetes. No need for meds at this time. Pt will focus on adherence to meal plan more.  3. Environmental and seasonal allergies Preventative strategies discussed with pt, which has been showering after  exposure and pt is doing well with that. Continue current treatment plan.  Refill- levocetirizine (XYZAL) 5 MG tablet; Take 1 tablet (5 mg total) by mouth every evening.  Dispense: 30 tablet; Refill: 0  4. At risk for depression Tyera Hansley was given approximately 9 minutes of depression prevention counseling today due to their higher than average risk for this condition. The patient has several risk factors for depression such as chronic medical conditions, sleep issues, major life stressors/events, etc., and we discussed these today. Kalianna Verbeke was also counseled on the importance of a healthy work-life balance, a healthy relationship with food, and a good support system.  We discussed various strategies to help cope with these emotions as well. I recommended counseling, meditation or prayer, healthy eating habits, sleep hygiene, and exercising to help manage these feelings.   5. Obesity, Current BMI 31.4 Macel is currently in the action stage of change. As such, her goal is to continue with weight loss efforts. She has agreed to the Category 1 Plan.   Start back walking regularly. Goal is to adhere to meal plan closer.  Exercise goals: All adults should avoid inactivity. Some physical activity is better than none, and adults who participate in any amount of physical activity gain some health benefits.  Behavioral modification strategies: emotional eating strategies and avoiding temptations.  Bretta has agreed to follow-up with our clinic in 3 weeks. She was informed of the importance of frequent follow-up visits to maximize her  success with intensive lifestyle modifications for her multiple health conditions.   Objective:   Blood pressure 123/78, pulse (!) 56, temperature 98.5 F (36.9 C), height 5\' 7"  (1.702 m), weight 200 lb (90.7 kg), last menstrual period 12/21/2013, SpO2 97 %. Body mass index is 31.32 kg/m.  General: Cooperative, alert, well developed, in no acute distress. HEENT:  Conjunctivae and lids unremarkable. Cardiovascular: Regular rhythm.  Lungs: Normal work of breathing. Neurologic: No focal deficits.   Lab Results  Component Value Date   CREATININE 0.83 11/12/2021   BUN 20 11/12/2021   NA 139 11/12/2021   K 4.4 11/12/2021   CL 101 11/12/2021   CO2 24 11/12/2021   Lab Results  Component Value Date   ALT 18 11/12/2021   AST 21 11/12/2021   ALKPHOS 75 11/12/2021   BILITOT 0.4 11/12/2021   Lab Results  Component Value Date   HGBA1C 5.6 11/12/2021   HGBA1C 5.5 07/15/2021   HGBA1C 6.1 (H) 03/03/2021   Lab Results  Component Value Date   INSULIN 4.7 11/12/2021   INSULIN 8.5 03/03/2021   Lab Results  Component Value Date   TSH 1.630 03/03/2021   Lab Results  Component Value Date   CHOL 236 (H) 11/12/2021   HDL 76 11/12/2021   LDLCALC 151 (H) 11/12/2021   TRIG 55 11/12/2021   CHOLHDL 3.1 11/12/2021   Lab Results  Component Value Date   VD25OH 57.9 11/12/2021   VD25OH 65.6 03/03/2021   VD25OH 53.3 02/23/2017   Lab Results  Component Value Date   WBC 9.2 03/03/2021   HGB 14.7 03/03/2021   HCT 46.2 03/03/2021   MCV 90 03/03/2021   PLT 285 03/03/2021    Attestation Statements:   Reviewed by clinician on day of visit: allergies, medications, problem list, medical history, surgical history, family history, social history, and previous encounter notes.  I, 05/04/2021, BS, CMA, am acting as transcriptionist for Kyung Rudd, DO.  I have reviewed the above documentation for accuracy and completeness, and I agree with the above. -  ***

## 2022-02-23 ENCOUNTER — Telehealth (INDEPENDENT_AMBULATORY_CARE_PROVIDER_SITE_OTHER): Payer: 59 | Admitting: Psychology

## 2022-02-23 DIAGNOSIS — F5089 Other specified eating disorder: Secondary | ICD-10-CM | POA: Diagnosis not present

## 2022-02-23 DIAGNOSIS — F909 Attention-deficit hyperactivity disorder, unspecified type: Secondary | ICD-10-CM | POA: Diagnosis not present

## 2022-02-23 DIAGNOSIS — F4320 Adjustment disorder, unspecified: Secondary | ICD-10-CM | POA: Diagnosis not present

## 2022-03-03 NOTE — Progress Notes (Addendum)
  Office: [phone]  /  Fax: [phone]    Date: 03/16/2022   Appointment Start Time: 4:32pm Duration: 35 minutes Provider: Lawerance Cruel, Psy.D. Type of Session: Individual Therapy  Location of Patient: Work (private location) Location of Provider: Provider's Home (private office) Type of Contact: Telepsychological Visit via MyChart Video Visit  Session Content: Katherine Adams is a 61 y.o. female presenting for a follow-up appointment to address the previously established treatment goal of increasing coping skills.Today's appointment was a telepsychological visit. Katherine Adams provided verbal consent for today's telepsychological appointment and she is aware she is responsible for securing confidentiality on her end of the session. Prior to proceeding with today's appointment, Katherine Adams's physical location at the time of this appointment was obtained as well a phone number she could be reached at in the event of technical difficulties. Katherine Adams and this provider participated in today's telepsychological service.   This provider conducted a brief check-in. Katherine Adams stated she was diagnosed with ADHD by her PCP, and was prescribed Wellbutrin. She further shared her PCP discussed the option of a referral to neurology in the future if deemed necessary. Reviewed values. Katherine Adams discussed adjusting recipes to meet her current goals, increasing self-care by reading in the evenings, and stretching. Positive reinforcement was provided. This provider and Katherine Adams discussed how the aforementioned has impacted her overall eating habits, including emotional eating behaviors. She reported an improvement. She was encouraged to use reminders to help with her current goals. Overall, Katherine Adams was receptive to today's appointment as evidenced by openness to sharing, responsiveness to feedback, and willingness to continue engaging in learned skills.  Mental Status Examination:  Appearance: neat Behavior: appropriate to circumstances Mood: neutral Affect: mood  congruent Speech: WNL Eye Contact: appropriate Psychomotor Activity: WNL Gait: unable to assess Thought Process: linear, logical, and goal directed and no evidence or endorsement of suicidal, homicidal, and self-harm ideation, plan and intent  Thought Content/Perception: no hallucinations, delusions, bizarre thinking or behavior endorsed or observed Orientation: AAOx4 Memory/Concentration: memory, attention, language, and fund of knowledge intact  Insight: good Judgment: fair  Interventions:  Conducted a brief chart review Provided empathic reflections and validation Reviewed content from the previous session Provided positive reinforcement Employed supportive psychotherapy interventions to facilitate reduced distress and to improve coping skills with identified stressors  DSM-5 Diagnosis(es):  F50.89 Other Specified Feeding or Eating Disorder, Emotional Eating Behaviors and  F43.20 Adjustment Disorder, Unspecified, and F90.9 Unspecified Attention-Deficit/Hyperactivity Disorder   Treatment Goal & Progress: During the initial appointment with this provider, the following treatment goal was established: increase coping skills. [name] has demonstrated progress in her goal as evidenced by increased awareness of hunger patterns, increased awareness of triggers for emotional eating behaviors, and reduction in emotional eating behaviors . [name] also continues to demonstrate willingness to engage in learned skill(s).  Plan: Based on appointment availability and [name]'s schedule, the next appointment is scheduled for 04/13/2022 at 4:30pm, which will be via MyChart Video Visit. The next session will focus on working towards the established treatment goal. Additional referral options for therapeutic services will be sent via e-mail.

## 2022-03-04 ENCOUNTER — Encounter (INDEPENDENT_AMBULATORY_CARE_PROVIDER_SITE_OTHER): Payer: Self-pay | Admitting: Family Medicine

## 2022-03-04 ENCOUNTER — Ambulatory Visit (INDEPENDENT_AMBULATORY_CARE_PROVIDER_SITE_OTHER): Payer: Managed Care, Other (non HMO) | Admitting: Family Medicine

## 2022-03-04 VITALS — BP 117/70 | HR 59 | Temp 98.7°F | Ht 67.0 in | Wt 201.0 lb

## 2022-03-04 DIAGNOSIS — E7849 Other hyperlipidemia: Secondary | ICD-10-CM | POA: Diagnosis not present

## 2022-03-04 DIAGNOSIS — Z6831 Body mass index (BMI) 31.0-31.9, adult: Secondary | ICD-10-CM

## 2022-03-04 DIAGNOSIS — E669 Obesity, unspecified: Secondary | ICD-10-CM | POA: Diagnosis not present

## 2022-03-04 DIAGNOSIS — Z9189 Other specified personal risk factors, not elsewhere classified: Secondary | ICD-10-CM

## 2022-03-04 DIAGNOSIS — R7303 Prediabetes: Secondary | ICD-10-CM

## 2022-03-04 DIAGNOSIS — J3089 Other allergic rhinitis: Secondary | ICD-10-CM | POA: Diagnosis not present

## 2022-03-04 MED ORDER — LEVOCETIRIZINE DIHYDROCHLORIDE 5 MG PO TABS: 5.0000 mg | ORAL_TABLET | Freq: Every evening | ORAL | 0 refills | Status: DC

## 2022-03-11 NOTE — Progress Notes (Signed)
Chief Complaint:   OBESITY Katherine Adams is here to discuss her progress with her obesity treatment plan along with follow-up of her obesity related diagnoses. Katherine Adams is on the Category 1 Plan with breakfast and lunch options and states she is following her eating plan approximately 50% of the time. Katherine Adams states she is walking more.  Today's visit was #: 17 Starting weight: 218 lbs Starting date: 03/03/2021 Today's weight: 201 lbs Today's date: 03/04/2022 Total lbs lost to date: 17 Total lbs lost since last in-office visit: +1  Interim History: Katherine Adams has been increasing her protein intake and trying to decrease simple carbs. She can really tell the difference when she does, as she has less cravings and feels more full/satisfied when following the meal plan closer. Pt did go on vacation recently and is happy with only 1 lb weight gain.  Subjective:   1. Other hyperlipidemia Katherine Adams's last lipid panel was 3/16 and showed worsening LDL at 151, which was up from 136 more than 6 months prior.  2. Prediabetes Three months ago, pt's A1c was 5.6 and fasting insulin was 4.7. One year ago her A1c was 6.1 with a fasting insulin of 8.5. She denies issues wit meal plan. Pt also denies hunger or cravings , as she has been trying to decrease simple carbs and increase protein and can tell a difference.  3. Environmental and seasonal allergies Katherine Adams's symptoms are much better controlled with Xyzal than Zyrtec. No need for Flonase or other meds at this time.  4. At risk for dehydration Katherine Adams is at risk for dehydration due to always struggling with water intake and now it is summer.  Assessment/Plan:  No orders of the defined types were placed in this encounter.   Medications Discontinued During This Encounter  Medication Reason   levocetirizine (XYZAL) 5 MG tablet Reorder     Meds ordered this encounter  Medications   levocetirizine (XYZAL) 5 MG tablet    Sig: Take 1 tablet (5 mg total) by mouth every evening.     Dispense:  30 tablet    Refill:  0    30 d supply;  ** OV for RF **   Do not send RF request     1. Other hyperlipidemia Cardiovascular risk and specific lipid/LDL goals reviewed.  We discussed several lifestyle modifications today and Katherine Adams will continue to work on diet, exercise and weight loss efforts. Orders and follow up as documented in patient record. Check CMP and FLP at next OV. Continue prudent nutritional plan and increase exercise.  Counseling Intensive lifestyle modifications are the first line treatment for this issue. Dietary changes: Increase soluble fiber. Decrease simple carbohydrates. Exercise changes: Moderate to vigorous-intensity aerobic activity 150 minutes per week if tolerated. Lipid-lowering medications: see documented in medical record.  2. Prediabetes Katherine Adams will continue to work on prudent nutritional plan, weight loss, increasing exercise, and decreasing simple carbohydrates to help decrease the risk of diabetes. Check A1c and fasting insulin at next OV.  3. Environmental and seasonal allergies Continue preventative strategies for environmental allergies. Continue meds and wash fae/hair/body after prolonged exposure.  Refill- levocetirizine (XYZAL) 5 MG tablet; Take 1 tablet (5 mg total) by mouth every evening.  Dispense: 30 tablet; Refill: 0  4. At risk for dehydration Katherine Adams is at higher than average risk of dehydration.  Katherine Adams was given more than 9 minutes of proper hydration counseling today.  We discussed the signs and symptoms of dehydration, some of which may include muscle  cramping, constipation or even orthostatic symptoms.  Counseling on the prevention of dehydration was also provided today.  Katherine Adams is at risk for dehydration due to weight loss, lifestyle and behavorial habits and possibly due to taking certain medication(s).  She was encouraged to adequately hydrate and monitor fluid status to avoid dehydration as well as weight loss plateaus.  Unless pre-existing  renal or cardiopulmonary conditions exist, in which patient was told to limit their fluid intake, I recommended roughly one half of their weight in pounds to be the approximate ounces of non-caloric, non-caffeinated beverages they should drink per day; including more if they are engaging in exercise.  Katherine Adams is at higher than average risk of dehydration.  Katherine Adams was given more than 9 minutes of proper hydration counseling today.  We discussed the signs and symptoms of dehydration, some of which may include muscle cramping, constipation, or even orthostatic symptoms.  Counseling on the prevention of dehydration was also provided today.  Katherine Adams is at risk for dehydration due to weight loss, lifestyle and behavorial habits, and possibly due to taking certain medication(s).  She was encouraged to adequately hydrate and monitor fluid status to avoid dehydration as well as weight loss plateaus.  Unless pre-existing renal or cardiopulmonary conditions exist, which pt was told to limit their fluid intake.  I recommended roughly one half of their weight in pounds to be the approximate ounces of non-caloric, non-caffeinated beverages they should drink per day; including more if they are engaging in exercise.  5. Obesity, Current BMI 31.5 Katherine Adams is currently in the action stage of change. As such, her goal is to continue with weight loss efforts. She has agreed to the Category 1 Plan with breakfast and lunch options.   We will repeat IC at next OV and obtain blood work. However, pt has a CPE in the near future, thus may not need additional labs.  Exercise goals: Pt encouraged to increase walking and work up to 30 minutes 5 days a week. For substantial health benefits, adults should do at least 150 minutes (2 hours and 30 minutes) a week of moderate-intensity, or 75 minutes (1 hour and 15 minutes) a week of vigorous-intensity aerobic physical activity, or an equivalent combination of moderate- and vigorous-intensity aerobic  activity. Aerobic activity should be performed in episodes of at least 10 minutes, and preferably, it should be spread throughout the week.  Behavioral modification strategies: better snacking choices and planning for success.  Katherine Adams has agreed to follow-up with our clinic in 3 weeks (come fasting for repeat IC and possibly blood work). She was informed of the importance of frequent follow-up visits to maximize her success with intensive lifestyle modifications for her multiple health conditions.   Objective:   Blood pressure 117/70, pulse (!) 59, temperature 98.7 F (37.1 C), height 5\' 7"  (1.702 m), weight 201 lb (91.2 kg), last menstrual period 12/21/2013, SpO2 99 %. Body mass index is 31.48 kg/m.  General: Cooperative, alert, well developed, in no acute distress. HEENT: Conjunctivae and lids unremarkable. Cardiovascular: Regular rhythm.  Lungs: Normal work of breathing. Neurologic: No focal deficits.   Lab Results  Component Value Date   CREATININE 0.83 11/12/2021   BUN 20 11/12/2021   NA 139 11/12/2021   K 4.4 11/12/2021   CL 101 11/12/2021   CO2 24 11/12/2021   Lab Results  Component Value Date   ALT 18 11/12/2021   AST 21 11/12/2021   ALKPHOS 75 11/12/2021   BILITOT 0.4 11/12/2021   Lab  Results  Component Value Date   HGBA1C 5.6 11/12/2021   HGBA1C 5.5 07/15/2021   HGBA1C 6.1 (H) 03/03/2021   Lab Results  Component Value Date   INSULIN 4.7 11/12/2021   INSULIN 8.5 03/03/2021   Lab Results  Component Value Date   TSH 1.630 03/03/2021   Lab Results  Component Value Date   CHOL 236 (H) 11/12/2021   HDL 76 11/12/2021   LDLCALC 151 (H) 11/12/2021   TRIG 55 11/12/2021   CHOLHDL 3.1 11/12/2021   Lab Results  Component Value Date   VD25OH 57.9 11/12/2021   VD25OH 65.6 03/03/2021   VD25OH 53.3 02/23/2017   Lab Results  Component Value Date   WBC 9.2 03/03/2021   HGB 14.7 03/03/2021   HCT 46.2 03/03/2021   MCV 90 03/03/2021   PLT 285 03/03/2021    Attestation Statements:   Reviewed by clinician on day of visit: allergies, medications, problem list, medical history, surgical history, family history, social history, and previous encounter notes.  I, Kyung Rudd, BS, CMA, am acting as transcriptionist for [employer], DO.   I have reviewed the above documentation for accuracy and completeness, and I agree with the above. Carlye Grippe, D.O.  The 21st Century Cures Act was signed into law in 2016 which includes the topic of electronic health records.  This provides immediate access to information in MyChart.  This includes consultation notes, operative notes, office notes, lab results and pathology reports.  If you have any questions about what you read please let us know at your next visit so we can discuss your concerns and take corrective action if need be.  We are right here with you.

## 2022-03-16 ENCOUNTER — Telehealth (INDEPENDENT_AMBULATORY_CARE_PROVIDER_SITE_OTHER): Payer: 59 | Admitting: Psychology

## 2022-03-16 DIAGNOSIS — F5089 Other specified eating disorder: Secondary | ICD-10-CM

## 2022-03-16 DIAGNOSIS — F4320 Adjustment disorder, unspecified: Secondary | ICD-10-CM | POA: Diagnosis not present

## 2022-03-16 DIAGNOSIS — F909 Attention-deficit hyperactivity disorder, unspecified type: Secondary | ICD-10-CM | POA: Diagnosis not present

## 2022-03-29 ENCOUNTER — Ambulatory Visit (INDEPENDENT_AMBULATORY_CARE_PROVIDER_SITE_OTHER): Payer: Managed Care, Other (non HMO) | Admitting: Family Medicine

## 2022-03-29 ENCOUNTER — Encounter (INDEPENDENT_AMBULATORY_CARE_PROVIDER_SITE_OTHER): Payer: Self-pay | Admitting: Family Medicine

## 2022-03-29 VITALS — BP 106/69 | HR 57 | Temp 98.0°F | Ht 67.0 in | Wt 200.0 lb

## 2022-03-29 DIAGNOSIS — R0602 Shortness of breath: Secondary | ICD-10-CM | POA: Diagnosis not present

## 2022-03-29 DIAGNOSIS — R7303 Prediabetes: Secondary | ICD-10-CM

## 2022-03-29 DIAGNOSIS — E559 Vitamin D deficiency, unspecified: Secondary | ICD-10-CM | POA: Diagnosis not present

## 2022-03-29 DIAGNOSIS — E7849 Other hyperlipidemia: Secondary | ICD-10-CM

## 2022-03-29 DIAGNOSIS — E669 Obesity, unspecified: Secondary | ICD-10-CM

## 2022-03-29 DIAGNOSIS — J302 Other seasonal allergic rhinitis: Secondary | ICD-10-CM | POA: Diagnosis not present

## 2022-03-29 DIAGNOSIS — Z6831 Body mass index (BMI) 31.0-31.9, adult: Secondary | ICD-10-CM

## 2022-03-29 MED ORDER — LEVOCETIRIZINE DIHYDROCHLORIDE 5 MG PO TABS: 5.0000 mg | ORAL_TABLET | Freq: Every evening | ORAL | 0 refills | Status: DC

## 2022-03-30 LAB — CMP14+EGFR
ALT: 19 IU/L (ref 0–32)
AST: 28 IU/L (ref 0–40)
Albumin/Globulin Ratio: 1.8 (ref 1.2–2.2)
Albumin: 4.8 g/dL (ref 3.8–4.9)
Alkaline Phosphatase: 75 IU/L (ref 44–121)
BUN/Creatinine Ratio: 20 (ref 12–28)
BUN: 18 mg/dL (ref 8–27)
Bilirubin Total: 0.3 mg/dL (ref 0.0–1.2)
CO2: 24 mmol/L (ref 20–29)
Calcium: 9.6 mg/dL (ref 8.7–10.3)
Chloride: 101 mmol/L (ref 96–106)
Creatinine, Ser: 0.88 mg/dL (ref 0.57–1.00)
Globulin, Total: 2.6 g/dL (ref 1.5–4.5)
Glucose: 72 mg/dL (ref 70–99)
Potassium: 4.6 mmol/L (ref 3.5–5.2)
Sodium: 139 mmol/L (ref 134–144)
Total Protein: 7.4 g/dL (ref 6.0–8.5)
eGFR: 75 mL/min/{1.73_m2} (ref >59)

## 2022-03-30 LAB — LIPID PANEL WITH LDL/HDL RATIO
Cholesterol, Total: 231 mg/dL — ABNORMAL HIGH (ref 100–199)
HDL: 72 mg/dL (ref >39)
LDL Chol Calc (NIH): 148 mg/dL — ABNORMAL HIGH (ref 0–99)
LDL/HDL Ratio: 2.1 ratio (ref 0.0–3.2)
Triglycerides: 65 mg/dL (ref 0–149)
VLDL Cholesterol Cal: 11 mg/dL (ref 5–40)

## 2022-03-30 LAB — TSH: TSH: 1.48 u[IU]/mL (ref 0.450–4.500)

## 2022-03-30 LAB — VITAMIN D 25 HYDROXY (VIT D DEFICIENCY, FRACTURES): Vit D, 25-Hydroxy: 53.9 ng/mL (ref 30.0–100.0)

## 2022-03-30 LAB — INSULIN, RANDOM: INSULIN: 7.2 u[IU]/mL (ref 2.6–24.9)

## 2022-03-30 LAB — HEMOGLOBIN A1C
Est. average glucose Bld gHb Est-mCnc: 114 mg/dL
Hgb A1c MFr Bld: 5.6 % (ref 4.8–5.6)

## 2022-04-06 NOTE — Progress Notes (Unsigned)
Chief Complaint:   OBESITY Katherine Adams is here to discuss her progress with her obesity treatment plan along with follow-up of her obesity related diagnoses. Katherine Adams is on the Category 2 Plan and states she is following her eating plan approximately (Unknown)% of the time. Katherine Adams states she is doing 0 minutes 0 times per week.  Today's visit was #: 28 Starting weight: 218 lbs Starting date: 03/03/2021 Today's weight: 200 lbs Today's date: 03/29/2022 Total lbs lost to date: 18 Total lbs lost since last in-office visit: 1  Interim History: Katherine Adams continues to work on her weight loss. She is following the spirit of the Category 2 plan more from the letter of the plan, but she is mindful of her food choices. She is eating more organic vegetables and her hunger is mostly controlled.   Subjective:   1. Other hyperlipidemia Katherine Adams is working on decreasing cholesterol in diet. Her last LDL was still elevated.   2. Seasonal allergies Katherine Adams is stable on Xyzal, with no side effects noted. She requests a refill.   3. Vitamin D deficiency Katherine Adams is on OTC Vitamin D 2,000 IU daily, and she is due for labs.   4. Prediabetes Katherine Adams's A1c has been improving with her diet and exercise. She is due to have it repeated.   5. SOB (shortness of breath) on exertion Katherine Adams's symptoms are stable. She is due to have her VO2 rechecked.   Assessment/Plan:   1. Other hyperlipidemia We will check labs today. Amayrany will continue to work on diet, exercise and weight loss efforts. Orders and follow up as documented in patient record.   - Hemoglobin A1c - Lipid Panel With LDL/HDL Ratio  2. Seasonal allergies Katherine Adams will continue Xyzal 5 mg qhs and we will refill for 1 month.  - levocetirizine (XYZAL) 5 MG tablet; Take 1 tablet (5 mg total) by mouth every evening.  Dispense: 30 tablet; Refill: 0  3. Vitamin D deficiency We will check labs today. Katherine Adams will follow-up for routine testing of Vitamin D, at least 2-3 times per year to avoid  over-replacement.  - VITAMIN D 25 Hydroxy (Vit-D Deficiency, Fractures)  4. Prediabetes We will check labs today. Katherine Adams will continue to work on her diet, exercise, and decreasing simple carbohydrates to help decrease the risk of diabetes.   - CMP14+EGFR - Insulin, random - TSH  5. SOB (shortness of breath) on exertion Katherine Adams's repeat IC today shows RMR and VO2 have improved with her nutrition and exercise. It is still below goal, so she is to continue to increase her protein and strengthening exercises.   6. Obesity, Current BMI 31.4 Katherine Adams is currently in the action stage of change. As such, her goal is to continue with weight loss efforts. She has agreed to the Category 2 Plan.   Behavioral modification strategies: increasing lean protein intake and meal planning and cooking strategies.  Katherine Adams has agreed to follow-up with our clinic in 3 to 4 weeks. She was informed of the importance of frequent follow-up visits to maximize her success with intensive lifestyle modifications for her multiple health conditions.   Katherine Adams was informed we would discuss her lab results at her next visit unless there is a critical issue that needs to be addressed sooner. Katherine Adams agreed to keep her next visit at the agreed upon time to discuss these results.  Objective:   Blood pressure 106/69, pulse (!) 57, temperature 98 F (36.7 C), height '5\' 7"'  (1.702 m), weight 200 lb (90.7  kg), last menstrual period 12/21/2013, SpO2 98 %. Body mass index is 31.32 kg/m.  General: Cooperative, alert, well developed, in no acute distress. HEENT: Conjunctivae and lids unremarkable. Cardiovascular: Regular rhythm.  Lungs: Normal work of breathing. Neurologic: No focal deficits.   Lab Results  Component Value Date   CREATININE 0.88 03/29/2022   BUN 18 03/29/2022   NA 139 03/29/2022   K 4.6 03/29/2022   CL 101 03/29/2022   CO2 24 03/29/2022   Lab Results  Component Value Date   ALT 19 03/29/2022   AST 28 03/29/2022    ALKPHOS 75 03/29/2022   BILITOT 0.3 03/29/2022   Lab Results  Component Value Date   HGBA1C 5.6 03/29/2022   HGBA1C 5.6 11/12/2021   HGBA1C 5.5 07/15/2021   HGBA1C 6.1 (H) 03/03/2021   Lab Results  Component Value Date   INSULIN 7.2 03/29/2022   INSULIN 4.7 11/12/2021   INSULIN 8.5 03/03/2021   Lab Results  Component Value Date   TSH 1.480 03/29/2022   Lab Results  Component Value Date   CHOL 231 (H) 03/29/2022   HDL 72 03/29/2022   LDLCALC 148 (H) 03/29/2022   TRIG 65 03/29/2022   CHOLHDL 3.1 11/12/2021   Lab Results  Component Value Date   VD25OH 53.9 03/29/2022   VD25OH 57.9 11/12/2021   VD25OH 65.6 03/03/2021   Lab Results  Component Value Date   WBC 9.2 03/03/2021   HGB 14.7 03/03/2021   HCT 46.2 03/03/2021   MCV 90 03/03/2021   PLT 285 03/03/2021   No results found for: "IRON", "TIBC", "FERRITIN"  Attestation Statements:   Reviewed by clinician on day of visit: allergies, medications, problem list, medical history, surgical history, family history, social history, and previous encounter notes.   I, Trixie Dredge, am acting as transcriptionist for Dennard Nip, MD.  I have reviewed the above documentation for accuracy and completeness, and I agree with the above. -  Dennard Nip, MD

## 2022-04-07 ENCOUNTER — Encounter (INDEPENDENT_AMBULATORY_CARE_PROVIDER_SITE_OTHER): Payer: Self-pay

## 2022-04-13 ENCOUNTER — Telehealth (INDEPENDENT_AMBULATORY_CARE_PROVIDER_SITE_OTHER): Payer: Managed Care, Other (non HMO) | Admitting: Psychology

## 2022-04-15 DIAGNOSIS — F902 Attention-deficit hyperactivity disorder, combined type: Secondary | ICD-10-CM | POA: Insufficient documentation

## 2022-04-21 ENCOUNTER — Ambulatory Visit (INDEPENDENT_AMBULATORY_CARE_PROVIDER_SITE_OTHER): Payer: Managed Care, Other (non HMO) | Admitting: Family Medicine

## 2022-04-21 ENCOUNTER — Encounter (INDEPENDENT_AMBULATORY_CARE_PROVIDER_SITE_OTHER): Payer: Self-pay | Admitting: Family Medicine

## 2022-04-21 VITALS — BP 106/71 | HR 62 | Temp 97.7°F | Ht 67.0 in | Wt 200.0 lb

## 2022-04-21 DIAGNOSIS — E559 Vitamin D deficiency, unspecified: Secondary | ICD-10-CM | POA: Diagnosis not present

## 2022-04-21 DIAGNOSIS — Z6834 Body mass index (BMI) 34.0-34.9, adult: Secondary | ICD-10-CM

## 2022-04-21 DIAGNOSIS — Z6831 Body mass index (BMI) 31.0-31.9, adult: Secondary | ICD-10-CM

## 2022-04-21 DIAGNOSIS — Z9189 Other specified personal risk factors, not elsewhere classified: Secondary | ICD-10-CM

## 2022-04-21 DIAGNOSIS — J302 Other seasonal allergic rhinitis: Secondary | ICD-10-CM | POA: Diagnosis not present

## 2022-04-21 DIAGNOSIS — E7849 Other hyperlipidemia: Secondary | ICD-10-CM | POA: Diagnosis not present

## 2022-04-21 DIAGNOSIS — R7303 Prediabetes: Secondary | ICD-10-CM | POA: Diagnosis not present

## 2022-04-21 DIAGNOSIS — E669 Obesity, unspecified: Secondary | ICD-10-CM

## 2022-04-21 MED ORDER — LEVOCETIRIZINE DIHYDROCHLORIDE 5 MG PO TABS: 5.0000 mg | ORAL_TABLET | Freq: Every evening | ORAL | 0 refills | Status: DC

## 2022-04-21 NOTE — Patient Instructions (Signed)
The 10-year ASCVD risk score (Arnett DK, et al., 2019) is: 2.1%   Values used to calculate the score:     Age: 61 years     Sex: Female     Is Non-Hispanic African American: No     Diabetic: No     Tobacco smoker: No     Systolic Blood Pressure: 106 mmHg     Is BP treated: No     HDL Cholesterol: 72 mg/dL     Total Cholesterol: 231 mg/dL

## 2022-04-26 ENCOUNTER — Telehealth (INDEPENDENT_AMBULATORY_CARE_PROVIDER_SITE_OTHER): Payer: Self-pay | Admitting: Psychology

## 2022-04-26 NOTE — Telephone Encounter (Signed)
  Office: [phone]  /  Fax: [phone]  Date of Call: April 26, 2022  Time of Call: 3:01pm Provider: Lawerance Cruel, PsyD  CONTENT:  This provider called Dewayne Hatch to check-in and schedule a follow-up appointment. A HIPAA compliant voicemail was left requesting a call back.   PLAN: This provider will wait for Octavia to call back. No further follow-up planned by this provider.

## 2022-04-27 NOTE — Progress Notes (Signed)
Chief Complaint:   OBESITY Katherine Adams is here to discuss her progress with her obesity treatment plan along with follow-up of her obesity related diagnoses. Marilouise is on the Category 1 Plan with breakfast and lunch options and states she is following her eating plan approximately 60% of the time. Delfina states she is walking and strength training 5-10 minutes 4-6 times per week.  Today's visit was #: 19 Starting weight: 218 lbs Starting date: 03/03/2021 Today's weight: 202 lbs Today's date: 04/21/2022 Total lbs lost to date: 16 Total lbs lost since last in-office visit: +2  Interim History: Ayleen has changed her coffee creamer and is using a lot less calories. Challenges lately have been the heat causing her to move less and she admits to drinking things she shouldn't.  Subjective:   1. Prediabetes Discussed labs with patient today.- CMP, Fasting insulin, A1c Darinda has questions regarding chromium picolinate.  2. Seasonal allergies Symptoms stable.  3. Other hyperlipidemia Discussed labs with patient today. Her ASCVD risk score is 2.1% and LDL is 148. The 10-year ASCVD risk score (Arnett DK, et al., 2019) is: 2.1%   Values used to calculate the score:     Age: 40 years     Sex: Female     Is Non-Hispanic African American: No     Diabetic: No     Tobacco smoker: No     Systolic Blood Pressure: 106 mmHg     Is BP treated: No     HDL Cholesterol: 72 mg/dL     Total Cholesterol: 231 mg/dL  4. Vitamin D deficiency Discussed labs with patient today. She is currently taking OTC vitamin D 2000 IU each day. She denies nausea, vomiting or muscle weakness.  5. At risk of diabetes mellitus Rateel is at risk for diabetes mellitus due to insulin resistance and pre-diabetes.  Assessment/Plan:  No orders of the defined types were placed in this encounter.   Medications Discontinued During This Encounter  Medication Reason   levocetirizine (XYZAL) 5 MG tablet Reorder     Meds ordered this  encounter  Medications   levocetirizine (XYZAL) 5 MG tablet    Sig: Take 1 tablet (5 mg total) by mouth every evening.    Dispense:  30 tablet    Refill:  0    30 d supply;  ** OV for RF **   Do not send RF request     1. Prediabetes Nayelie will continue to work on weight loss, exercise, and decreasing simple carbohydrates to help decrease the risk of diabetes. Meerab was provided with Metformin handouts, but declines starting it today. All labs are stable and essentially unchanged from prior.  2. Seasonal allergies Continue current treatment plan.  Refill- levocetirizine (XYZAL) 5 MG tablet; Take 1 tablet (5 mg total) by mouth every evening.  Dispense: 30 tablet; Refill: 0  3. Other hyperlipidemia Cardiovascular risk and specific lipid/LDL goals reviewed.  We discussed several lifestyle modifications today and Cova will continue to work on diet, exercise and weight loss efforts. Orders and follow up as documented in patient record.  No need for meds at this time. Decrease saturated and trans fats. Follow prudent nutritional plan and increase exercise.  Counseling Intensive lifestyle modifications are the first line treatment for this issue. Dietary changes: Increase soluble fiber. Decrease simple carbohydrates. Exercise changes: Moderate to vigorous-intensity aerobic activity 150 minutes per week if tolerated. Lipid-lowering medications: see documented in medical record.  4. Vitamin D deficiency At goal. Low  Vitamin D level contributes to fatigue and are associated with obesity, breast, and colon cancer. She agrees to continue OTC Vitamin D 2,000 IU daily and will follow-up for routine testing of Vitamin D, at least 2-3 times per year to avoid over-replacement.  5. At risk of diabetes mellitus - Matayah was given diabetes prevention education and counseling today of more than 23 minutes.  - Counseled patient on pathophysiology of disease and meaning/ implication of lab results.  - Reviewed how  certain foods can either stimulate or inhibit insulin release, and subsequently affect hunger pathways  - Importance of following a healthy meal plan with limiting amounts of simple carbohydrates discussed with patient - Effects of regular aerobic exercise on blood sugar regulation reviewed and encouraged an eventual goal of 30 min 5d/week or more as a minimum.  - Briefly discussed treatment options, which always include dietary and lifestyle modification as first line.   - Handouts provided at patient's desire and/or told to go online to the American Diabetes Association website for further information.  6. Obesity, Current BMI 31.6 Brithany is currently in the action stage of change. As such, her goal is to continue with weight loss efforts. She has agreed to the Category 1 Plan with breakfast and lunch options.   Exercise goals:  As is  Behavioral modification strategies: decreasing simple carbohydrates and planning for success.  Mairyn has agreed to follow-up with our clinic in 3-4 weeks. She was informed of the importance of frequent follow-up visits to maximize her success with intensive lifestyle modifications for her multiple health conditions.   Objective:   Blood pressure 106/71, pulse 62, temperature 97.7 F (36.5 C), height 5\' 7"  (1.702 m), weight 200 lb (90.7 kg), last menstrual period 12/21/2013, SpO2 99 %. Body mass index is 31.32 kg/m.  General: Cooperative, alert, well developed, in no acute distress. HEENT: Conjunctivae and lids unremarkable. Cardiovascular: Regular rhythm.  Lungs: Normal work of breathing. Neurologic: No focal deficits.   Lab Results  Component Value Date   CREATININE 0.88 03/29/2022   BUN 18 03/29/2022   NA 139 03/29/2022   K 4.6 03/29/2022   CL 101 03/29/2022   CO2 24 03/29/2022   Lab Results  Component Value Date   ALT 19 03/29/2022   AST 28 03/29/2022   ALKPHOS 75 03/29/2022   BILITOT 0.3 03/29/2022   Lab Results  Component Value Date    HGBA1C 5.6 03/29/2022   HGBA1C 5.6 11/12/2021   HGBA1C 5.5 07/15/2021   HGBA1C 6.1 (H) 03/03/2021   Lab Results  Component Value Date   INSULIN 7.2 03/29/2022   INSULIN 4.7 11/12/2021   INSULIN 8.5 03/03/2021   Lab Results  Component Value Date   TSH 1.480 03/29/2022   Lab Results  Component Value Date   CHOL 231 (H) 03/29/2022   HDL 72 03/29/2022   LDLCALC 148 (H) 03/29/2022   TRIG 65 03/29/2022   CHOLHDL 3.1 11/12/2021   Lab Results  Component Value Date   VD25OH 53.9 03/29/2022   VD25OH 57.9 11/12/2021   VD25OH 65.6 03/03/2021   Lab Results  Component Value Date   WBC 9.2 03/03/2021   HGB 14.7 03/03/2021   HCT 46.2 03/03/2021   MCV 90 03/03/2021   PLT 285 03/03/2021    Attestation Statements:   Reviewed by clinician on day of visit: allergies, medications, problem list, medical history, surgical history, family history, social history, and previous encounter notes.  Time spent on visit including pre-visit chart review and  post-visit care and charting was 40 minutes.   I, Kathlene November, BS, CMA, am acting as transcriptionist for [employer], DO.   I have reviewed the above documentation for accuracy and completeness, and I agree with the above. Marjory Sneddon, D.O.  The [place] was signed into law in 2016 which includes the topic of electronic health records.  This provides immediate access to information in MyChart.  This includes consultation notes, operative notes, office notes, lab results and pathology reports.  If you have any questions about what you read please let us know at your next visit so we can discuss your concerns and take corrective action if need be.  We are right here with you.

## 2022-04-28 ENCOUNTER — Ambulatory Visit (INDEPENDENT_AMBULATORY_CARE_PROVIDER_SITE_OTHER): Payer: Managed Care, Other (non HMO) | Admitting: Family Medicine

## 2022-05-24 ENCOUNTER — Ambulatory Visit (INDEPENDENT_AMBULATORY_CARE_PROVIDER_SITE_OTHER): Payer: Managed Care, Other (non HMO) | Admitting: Family Medicine

## 2022-06-09 ENCOUNTER — Ambulatory Visit (INDEPENDENT_AMBULATORY_CARE_PROVIDER_SITE_OTHER): Payer: Managed Care, Other (non HMO) | Admitting: Family Medicine

## 2022-06-09 ENCOUNTER — Encounter (INDEPENDENT_AMBULATORY_CARE_PROVIDER_SITE_OTHER): Payer: Self-pay | Admitting: Family Medicine

## 2022-06-09 VITALS — BP 129/84 | HR 67 | Temp 98.3°F | Ht 67.0 in | Wt 202.0 lb

## 2022-06-09 DIAGNOSIS — J302 Other seasonal allergic rhinitis: Secondary | ICD-10-CM

## 2022-06-09 DIAGNOSIS — J3089 Other allergic rhinitis: Secondary | ICD-10-CM | POA: Diagnosis not present

## 2022-06-09 DIAGNOSIS — E7849 Other hyperlipidemia: Secondary | ICD-10-CM

## 2022-06-09 DIAGNOSIS — E559 Vitamin D deficiency, unspecified: Secondary | ICD-10-CM | POA: Diagnosis not present

## 2022-06-09 DIAGNOSIS — E669 Obesity, unspecified: Secondary | ICD-10-CM | POA: Diagnosis not present

## 2022-06-09 DIAGNOSIS — E66811 Obesity, class 1: Secondary | ICD-10-CM

## 2022-06-09 DIAGNOSIS — Z6831 Body mass index (BMI) 31.0-31.9, adult: Secondary | ICD-10-CM

## 2022-06-27 MED ORDER — LEVOCETIRIZINE DIHYDROCHLORIDE 5 MG PO TABS: 5.0000 mg | ORAL_TABLET | Freq: Every evening | ORAL | 0 refills | Status: DC

## 2022-07-02 ENCOUNTER — Other Ambulatory Visit: Payer: Self-pay | Admitting: Obstetrics and Gynecology

## 2022-07-02 DIAGNOSIS — Z1231 Encounter for screening mammogram for malignant neoplasm of breast: Secondary | ICD-10-CM

## 2022-07-03 NOTE — Progress Notes (Unsigned)
Chief Complaint:   OBESITY Katherine Adams is here to discuss her progress with her obesity treatment plan along with follow-up of her obesity related diagnoses. Katherine Adams is on the Category 1 Plan with breakfast and lunch options and states she is following her eating plan approximately 50% of the time. Katherine Adams states she is not currently exercising.  Today's visit was #: 20 Starting weight: 218 lbs Starting date: 03/03/2021 Today's weight: 202 lbs Today's date: 06/09/2022 Total lbs lost to date: 16 Total lbs lost since last in-office visit: 0  Interim History: Katherine Adams has been dealing with her elderly dog, Ellie, who is nearing her end of life. Pt has more stress in life and home and job. She is only up 2 lbs. Pt desires to set goals to help her get back on track.  Subjective:   1. Vitamin D deficiency Katherine Adams Vit D level 3 months ago was 53.9. Pt is taking OTC Vit D 2,000 IU daily.  2. Other hyperlipidemia Katherine Adams LDL was 148 approximately 3 months ago. Her HDL was great at 72 and triglycerides 65.  3. Environmental and seasonal allergies Katherine Adams is a little more congested in the morning due to allergies lately, but overall, symptoms are well controlled.  Assessment/Plan:  No orders of the defined types were placed in this encounter.   Medications Discontinued During This Encounter  Medication Reason   levocetirizine (XYZAL) 5 MG tablet Reorder     Meds ordered this encounter  Medications   levocetirizine (XYZAL) 5 MG tablet    Sig: Take 1 tablet (5 mg total) by mouth every evening.    Dispense:  30 tablet    Refill:  0    30 d supply;  ** OV for RF **   Do not send RF request     1. Vitamin D deficiency Vit D at goal. Low Vitamin D level contributes to fatigue and are associated with obesity, breast, and colon cancer. She agrees to continue to take OTC Vitamin D 2,000 IU daily to aid with weight loss and will follow-up for routine testing of Vitamin D, at least 2-3 times per year to avoid  over-replacement.  2. Other hyperlipidemia Reminded pt of the need to decrease saturated and trans fats to help with cholesterol. This is familial, however, and pt is aware diet and exercise can only do so much. Follow up with PCP for annual physical.  3. Environmental and seasonal allergies Start OTC sterile saline rinses (i.e. netti pot) as needed daily or twice a day after exposure.  Refill- levocetirizine (XYZAL) 5 MG tablet; Take 1 tablet (5 mg total) by mouth every evening.  Dispense: 30 tablet; Refill: 0  4. Obesity, Current BMI 31.7 Katherine Adams is currently in the action stage of change. As such, her goal is to continue with weight loss efforts. She has agreed to the Category 1 Plan with breakfast and lunch options.   Pt goals: Do 20 minutes of walking 3 days a week. Only eat out 1 day a week.  Exercise goals: All adults should avoid inactivity. Some physical activity is better than none, and adults who participate in any amount of physical activity gain some health benefits.  Behavioral modification strategies: decreasing simple carbohydrates and decreasing eating out.  Katherine Adams has agreed to follow-up with our clinic in 3-4 weeks. She was informed of the importance of frequent follow-up visits to maximize her success with intensive lifestyle modifications for her multiple health conditions.   Objective:   Blood  pressure 129/84, pulse 67, temperature 98.3 F (36.8 C), height 5\' 7"  (1.702 m), weight 202 lb (91.6 kg), last menstrual period 12/21/2013, SpO2 97 %. Body mass index is 31.64 kg/m.  General: Cooperative, alert, well developed, in no acute distress. HEENT: Conjunctivae and lids unremarkable. Cardiovascular: Regular rhythm.  Lungs: Normal work of breathing. Neurologic: No focal deficits.   Lab Results  Component Value Date   CREATININE 0.88 03/29/2022   BUN 18 03/29/2022   NA 139 03/29/2022   K 4.6 03/29/2022   CL 101 03/29/2022   CO2 24 03/29/2022   Lab Results   Component Value Date   ALT 19 03/29/2022   AST 28 03/29/2022   ALKPHOS 75 03/29/2022   BILITOT 0.3 03/29/2022   Lab Results  Component Value Date   HGBA1C 5.6 03/29/2022   HGBA1C 5.6 11/12/2021   HGBA1C 5.5 07/15/2021   HGBA1C 6.1 (H) 03/03/2021   Lab Results  Component Value Date   INSULIN 7.2 03/29/2022   INSULIN 4.7 11/12/2021   INSULIN 8.5 03/03/2021   Lab Results  Component Value Date   TSH 1.480 03/29/2022   Lab Results  Component Value Date   CHOL 231 (H) 03/29/2022   HDL 72 03/29/2022   LDLCALC 148 (H) 03/29/2022   TRIG 65 03/29/2022   CHOLHDL 3.1 11/12/2021   Lab Results  Component Value Date   VD25OH 53.9 03/29/2022   VD25OH 57.9 11/12/2021   VD25OH 65.6 03/03/2021   Lab Results  Component Value Date   WBC 9.2 03/03/2021   HGB 14.7 03/03/2021   HCT 46.2 03/03/2021   MCV 90 03/03/2021   PLT 285 03/03/2021    Attestation Statements:   Reviewed by clinician on day of visit: allergies, medications, problem list, medical history, surgical history, family history, social history, and previous encounter notes.  I, 05/04/2021, BS, CMA, am acting as transcriptionist for Kyung Rudd, DO.   I have reviewed the above documentation for accuracy and completeness, and I agree with the above. [employer], D.O.  The 21st Century Cures Act was signed into law in 2016 which includes the topic of electronic health records.  This provides immediate access to information in MyChart.  This includes consultation notes, operative notes, office notes, lab results and pathology reports.  If you have any questions about what you read please let 2017 know at your next visit so we can discuss your concerns and take corrective action if need be.  We are right here with you.

## 2022-07-08 ENCOUNTER — Ambulatory Visit (INDEPENDENT_AMBULATORY_CARE_PROVIDER_SITE_OTHER): Payer: Managed Care, Other (non HMO) | Admitting: Family Medicine

## 2022-07-15 ENCOUNTER — Ambulatory Visit (INDEPENDENT_AMBULATORY_CARE_PROVIDER_SITE_OTHER): Payer: Managed Care, Other (non HMO) | Admitting: Family Medicine

## 2022-07-15 ENCOUNTER — Encounter (INDEPENDENT_AMBULATORY_CARE_PROVIDER_SITE_OTHER): Payer: Self-pay | Admitting: Family Medicine

## 2022-07-15 VITALS — BP 116/79 | HR 65 | Temp 98.1°F | Ht 67.0 in | Wt 202.2 lb

## 2022-07-15 DIAGNOSIS — F430 Acute stress reaction: Secondary | ICD-10-CM | POA: Diagnosis not present

## 2022-07-15 DIAGNOSIS — Z6831 Body mass index (BMI) 31.0-31.9, adult: Secondary | ICD-10-CM | POA: Diagnosis not present

## 2022-07-15 DIAGNOSIS — E669 Obesity, unspecified: Secondary | ICD-10-CM

## 2022-07-28 NOTE — Progress Notes (Signed)
Chief Complaint:   OBESITY Katherine Adams is here to discuss her progress with her obesity treatment plan along with follow-up of her obesity related diagnoses. Katherine Adams is on the Category 1 Plan with breakfast and lunch options and states she is following her eating plan approximately 50% of the time. Katherine Adams states she is walking 15 minutes 3 times per week.  Today's visit was #:  21 Starting weight: 218 lbs Starting date: 03/03/2021 Today's weight: 202 lbs Today's date: 07/15/2022 Total lbs lost to date: 16 lbs Total lbs lost since last in-office visit: 0  Interim History: Their dog Ellie passed 3 weeks ago.  Patient has been emotionally struggling.  Sleeping more, but feels exhausted all day, all the time.  She and her husband Kathlene November, whom I also see, doing more emotional eating lately.    Subjective:   1. Acute reaction to stress Patient lost her "furry child" 3 weeks ago.  Positive for emotional eating.   Assessment/Plan:  No orders of the defined types were placed in this encounter.   There are no discontinued medications.   No orders of the defined types were placed in this encounter.    1. Acute reaction to stress Extensive discussion with patient on how to help her with sadness.  Improving emotional resilience.  Emotional eating strategies discussed with patient.  She denies needing mood medications beyond Prozac and Wellbutrin at this time.  No need for change in dose.   2. Obesity, Current BMI 31.7 Decrease eating out to 1 day per week.  Strategies discussed with patient.   Katherine Adams is currently in the action stage of change. As such, her goal is to continue with weight loss efforts. She has agreed to practicing portion control and making smarter food choices, such as increasing vegetables and decreasing simple carbohydrates.   Exercise goals:  Goal:  60 minute walk per week.   Behavioral modification strategies: increasing lean protein intake, decreasing simple carbohydrates, and  emotional eating strategies.  Katherine Adams has agreed to follow-up with our clinic in 3 weeks. She was informed of the importance of frequent follow-up visits to maximize her success with intensive lifestyle modifications for her multiple health conditions.   Objective:   Blood pressure 116/79, pulse 65, temperature 98.1 F (36.7 C), height 5\' 7"  (1.702 m), weight 202 lb 3.2 oz (91.7 kg), last menstrual period 12/21/2013, SpO2 97 %. Body mass index is 31.67 kg/m.  General: Cooperative, alert, well developed, in no acute distress. HEENT: Conjunctivae and lids unremarkable. Cardiovascular: Regular rhythm.  Lungs: Normal work of breathing. Neurologic: No focal deficits.   Lab Results  Component Value Date   CREATININE 0.88 03/29/2022   BUN 18 03/29/2022   NA 139 03/29/2022   K 4.6 03/29/2022   CL 101 03/29/2022   CO2 24 03/29/2022   Lab Results  Component Value Date   ALT 19 03/29/2022   AST 28 03/29/2022   ALKPHOS 75 03/29/2022   BILITOT 0.3 03/29/2022   Lab Results  Component Value Date   HGBA1C 5.6 03/29/2022   HGBA1C 5.6 11/12/2021   HGBA1C 5.5 07/15/2021   HGBA1C 6.1 (H) 03/03/2021   Lab Results  Component Value Date   INSULIN 7.2 03/29/2022   INSULIN 4.7 11/12/2021   INSULIN 8.5 03/03/2021   Lab Results  Component Value Date   TSH 1.480 03/29/2022   Lab Results  Component Value Date   CHOL 231 (H) 03/29/2022   HDL 72 03/29/2022   LDLCALC 148 (H)  03/29/2022   TRIG 65 03/29/2022   CHOLHDL 3.1 11/12/2021   Lab Results  Component Value Date   VD25OH 53.9 03/29/2022   VD25OH 57.9 11/12/2021   VD25OH 65.6 03/03/2021   Lab Results  Component Value Date   WBC 9.2 03/03/2021   HGB 14.7 03/03/2021   HCT 46.2 03/03/2021   MCV 90 03/03/2021   PLT 285 03/03/2021   No results found for: "IRON", "TIBC", "FERRITIN"  Attestation Statements:   Reviewed by clinician on day of visit: allergies, medications, problem list, medical history, surgical history, family  history, social history, and previous encounter notes.  I, Malcolm Metro, RMA, am acting as Energy manager for [employer], DO.  I have reviewed the above documentation for accuracy and completeness, and I agree with the above. Carlye Grippe, D.O.  The 21st Century Cures Act was signed into law in 2016 which includes the topic of electronic health records.  This provides immediate access to information in MyChart.  This includes consultation notes, operative notes, office notes, lab results and pathology reports.  If you have any questions about what you read please let us know at your next visit so we can discuss your concerns and take corrective action if need be.  We are right here with you.

## 2022-08-12 ENCOUNTER — Ambulatory Visit (INDEPENDENT_AMBULATORY_CARE_PROVIDER_SITE_OTHER): Payer: Managed Care, Other (non HMO) | Admitting: Family Medicine

## 2022-08-12 ENCOUNTER — Encounter (INDEPENDENT_AMBULATORY_CARE_PROVIDER_SITE_OTHER): Payer: Self-pay | Admitting: Family Medicine

## 2022-08-12 VITALS — BP 125/82 | HR 65 | Temp 98.5°F | Ht 67.0 in | Wt 205.0 lb

## 2022-08-12 DIAGNOSIS — J302 Other seasonal allergic rhinitis: Secondary | ICD-10-CM | POA: Diagnosis not present

## 2022-08-12 DIAGNOSIS — Z6832 Body mass index (BMI) 32.0-32.9, adult: Secondary | ICD-10-CM

## 2022-08-12 DIAGNOSIS — E669 Obesity, unspecified: Secondary | ICD-10-CM | POA: Diagnosis not present

## 2022-08-12 DIAGNOSIS — R7303 Prediabetes: Secondary | ICD-10-CM

## 2022-08-12 DIAGNOSIS — J3089 Other allergic rhinitis: Secondary | ICD-10-CM

## 2022-08-12 MED ORDER — LEVOCETIRIZINE DIHYDROCHLORIDE 5 MG PO TABS: 5.0000 mg | ORAL_TABLET | Freq: Every evening | ORAL | 0 refills | Status: DC

## 2022-08-25 NOTE — Progress Notes (Signed)
Chief Complaint:   OBESITY Katherine Adams is here to discuss her progress with her obesity treatment plan along with follow-up of her obesity related diagnoses. Katherine Adams is on the Category 1 Plan and states she is following her eating plan approximately 75% of the time. Katherine Adams states she is walking 10-20 minutes 3 times per week.  Today's visit was #: 57 Starting weight: 81 LBS Starting date: 03/03/2021 Today's weight: 205 LBS Today's date: 08/12/2022 Total lbs lost to date: 13 LBS Total lbs lost since last in-office visit: +3 LBS  Interim History: She did start walking some.  Meal plan was doing well until the weekend but went to [place] to see her daughter and ate out a lot.  Overall she is doing great, no issues with meal plan or concerns.  Subjective:   1. Prediabetes Patient is sleeping better, but less snacking and less emotional eating.  She denies any issues today.  2. Seasonal allergies Allergy symptoms well-controlled meds.  Tolerating well no side effects.  Assessment/Plan:  No orders of the defined types were placed in this encounter.   Medications Discontinued During This Encounter  Medication Reason   levocetirizine (XYZAL) 5 MG tablet Reorder     Meds ordered this encounter  Medications   levocetirizine (XYZAL) 5 MG tablet    Sig: Take 1 tablet (5 mg total) by mouth every evening.    Dispense:  30 tablet    Refill:  0    30 d supply;  ** OV for RF **   Do not send RF request     1. Prediabetes Gave patient handout on metformin, but patient really prefers to avoid any medications.  Increase protein, decrease simple carbs and lose weight.  Increase exercise as tolerated.  2. Seasonal allergies Preventative strategies discussed with patient.  Refill- levocetirizine (XYZAL) 5 MG tablet; Take 1 tablet (5 mg total) by mouth every evening.  Dispense: 30 tablet; Refill: 0  3. Obesity, Current BMI 32.1 Holiday recipe and holiday eating strategies given to patient.  Katherine Adams is  currently in the action stage of change. As such, her goal is to continue with weight loss efforts. She has agreed to practicing portion control and making smarter food choices, such as increasing vegetables and decreasing simple carbohydrates.   Exercise goals: As is.  But increase as tolerated.  Behavioral modification strategies: increasing lean protein intake and decreasing simple carbohydrates.  Katherine Adams has agreed to follow-up with our clinic in 4 weeks. She was informed of the importance of frequent follow-up visits to maximize her success with intensive lifestyle modifications for her multiple health conditions.   Objective:   Blood pressure 125/82, pulse 65, temperature 98.5 F (36.9 C), height 5\' 7"  (1.702 m), weight 205 lb (93 kg), last menstrual period 12/21/2013, SpO2 99 %. Body mass index is 32.11 kg/m.  General: Cooperative, alert, well developed, in no acute distress. HEENT: Conjunctivae and lids unremarkable. Cardiovascular: Regular rhythm.  Lungs: Normal work of breathing. Neurologic: No focal deficits.   Lab Results  Component Value Date   CREATININE 0.88 03/29/2022   BUN 18 03/29/2022   NA 139 03/29/2022   K 4.6 03/29/2022   CL 101 03/29/2022   CO2 24 03/29/2022   Lab Results  Component Value Date   ALT 19 03/29/2022   AST 28 03/29/2022   ALKPHOS 75 03/29/2022   BILITOT 0.3 03/29/2022   Lab Results  Component Value Date   HGBA1C 5.6 03/29/2022   HGBA1C 5.6 11/12/2021  HGBA1C 5.5 07/15/2021   HGBA1C 6.1 (H) 03/03/2021   Lab Results  Component Value Date   INSULIN 7.2 03/29/2022   INSULIN 4.7 11/12/2021   INSULIN 8.5 03/03/2021   Lab Results  Component Value Date   TSH 1.480 03/29/2022   Lab Results  Component Value Date   CHOL 231 (H) 03/29/2022   HDL 72 03/29/2022   LDLCALC 148 (H) 03/29/2022   TRIG 65 03/29/2022   CHOLHDL 3.1 11/12/2021   Lab Results  Component Value Date   VD25OH 53.9 03/29/2022   VD25OH 57.9 11/12/2021   VD25OH 65.6  03/03/2021   Lab Results  Component Value Date   WBC 9.2 03/03/2021   HGB 14.7 03/03/2021   HCT 46.2 03/03/2021   MCV 90 03/03/2021   PLT 285 03/03/2021   No results found for: "IRON", "TIBC", "FERRITIN"  Attestation Statements:   Reviewed by clinician on day of visit: allergies, medications, problem list, medical history, surgical history, family history, social history, and previous encounter notes.  I, Malcolm Metro, RMA, am acting as Energy manager for [employer], DO.   I have reviewed the above documentation for accuracy and completeness, and I agree with the above. Carlye Grippe, D.O.  The 21st Century Cures Act was signed into law in 2016 which includes the topic of electronic health records.  This provides immediate access to information in MyChart.  This includes consultation notes, operative notes, office notes, lab results and pathology reports.  If you have any questions about what you read please let us know at your next visit so we can discuss your concerns and take corrective action if need be.  We are right here with you.

## 2022-09-01 ENCOUNTER — Ambulatory Visit
Admission: RE | Admit: 2022-09-01 | Discharge: 2022-09-01 | Disposition: A | Discharge status: Home / Self Care | Payer: Managed Care, Other (non HMO) | Source: Ambulatory Visit | Attending: Obstetrics and Gynecology | Admitting: Obstetrics and Gynecology

## 2022-09-01 DIAGNOSIS — Z1231 Encounter for screening mammogram for malignant neoplasm of breast: Secondary | ICD-10-CM

## 2022-09-08 ENCOUNTER — Ambulatory Visit (INDEPENDENT_AMBULATORY_CARE_PROVIDER_SITE_OTHER): Payer: Managed Care, Other (non HMO) | Admitting: Family Medicine

## 2022-09-08 ENCOUNTER — Encounter (INDEPENDENT_AMBULATORY_CARE_PROVIDER_SITE_OTHER): Payer: Self-pay | Admitting: Family Medicine

## 2022-09-08 VITALS — BP 105/75 | HR 68 | Temp 98.1°F | Ht 67.0 in | Wt 202.4 lb

## 2022-09-08 DIAGNOSIS — Z9189 Other specified personal risk factors, not elsewhere classified: Secondary | ICD-10-CM

## 2022-09-08 DIAGNOSIS — E669 Obesity, unspecified: Secondary | ICD-10-CM | POA: Diagnosis not present

## 2022-09-08 DIAGNOSIS — J302 Other seasonal allergic rhinitis: Secondary | ICD-10-CM | POA: Diagnosis not present

## 2022-09-08 DIAGNOSIS — Z6831 Body mass index (BMI) 31.0-31.9, adult: Secondary | ICD-10-CM

## 2022-09-08 MED ORDER — LEVOCETIRIZINE DIHYDROCHLORIDE 5 MG PO TABS: 5.0000 mg | ORAL_TABLET | Freq: Every evening | ORAL | 0 refills | Status: DC

## 2022-09-09 DIAGNOSIS — Z9189 Other specified personal risk factors, not elsewhere classified: Secondary | ICD-10-CM | POA: Insufficient documentation

## 2022-09-16 ENCOUNTER — Encounter: Payer: Self-pay | Admitting: Obstetrics and Gynecology

## 2022-09-16 ENCOUNTER — Ambulatory Visit (INDEPENDENT_AMBULATORY_CARE_PROVIDER_SITE_OTHER): Payer: Managed Care, Other (non HMO) | Admitting: Obstetrics and Gynecology

## 2022-09-16 ENCOUNTER — Other Ambulatory Visit (HOSPITAL_COMMUNITY)
Admission: RE | Admit: 2022-09-16 | Discharge: 2022-09-16 | Disposition: A | Discharge status: Home / Self Care | Payer: Managed Care, Other (non HMO) | Source: Ambulatory Visit | Attending: Obstetrics and Gynecology | Admitting: Obstetrics and Gynecology

## 2022-09-16 VITALS — BP 124/82 | HR 66 | Ht 66.5 in | Wt 206.0 lb

## 2022-09-16 DIAGNOSIS — R635 Abnormal weight gain: Secondary | ICD-10-CM | POA: Insufficient documentation

## 2022-09-16 DIAGNOSIS — Z124 Encounter for screening for malignant neoplasm of cervix: Secondary | ICD-10-CM | POA: Diagnosis present

## 2022-09-16 DIAGNOSIS — Z1211 Encounter for screening for malignant neoplasm of colon: Secondary | ICD-10-CM | POA: Insufficient documentation

## 2022-09-16 DIAGNOSIS — Z01419 Encounter for gynecological examination (general) (routine) without abnormal findings: Secondary | ICD-10-CM

## 2022-09-16 DIAGNOSIS — N941 Unspecified dyspareunia: Secondary | ICD-10-CM | POA: Diagnosis not present

## 2022-09-16 NOTE — Patient Instructions (Signed)

## 2022-09-16 NOTE — Progress Notes (Signed)
62 y.o. G10P2002 Married White or Caucasian Declined female here for annual exam. She is having pain with sex, even with use of a lubricant. No vaginal bleeding.   She has a history of DVT and wants to talk about her options for vaginal dryness.        Patient's last menstrual period was 12/21/2013 (exact date).          Sexually active: Yes.    The current method of family planning is post menopausal status.    Exercising: Yes.     Walking  Smoker:  no  Health Maintenance: Pap:  03/21/19 WNL Hr HPV neg  History of abnormal Pap:  no MMG:  09/02/22 density C Bi-rads 1 neg  BMD:   10/21/10 normal  Colonoscopy: cologuard 05/06/21 normal  TDaP:  06/03/14 Gardasil: n/a   reports that she has never smoked. She has never used smokeless tobacco. She reports current alcohol use of about 1.0 - 2.0 standard drink of alcohol per week. She reports that she does not use drugs. Son and his wife live in [place], they had been estranged but are back in touch. They are having a baby in June. Daughter is in [place], NICU nurse, getting married in 2025.   Past Medical History:  Diagnosis Date   Acne vulgaris    Allergic rhinitis, cause unspecified    Anxiety    Bursitis 2004   left hip   Constipation    Depression    DJD (degenerative joint disease) of knee    Right knee   DVT (deep venous thrombosis) ([place]) 02/25/2010   Bilateral LE, after stress fx of foot   GERD (gastroesophageal reflux disease)    Headache(784.0)    migraines   Joint pain    Onychomycosis 2012   Osteoarthritis    Palpitation    Plantar fasciitis 2004   bilateral   Prediabetes    Scoliosis 2004   mild   Swelling    feet or legs   Vitamin D deficiency     Past Surgical History:  Procedure Laterality Date   ABLATION SAPHENOUS VEIN W/ RFA     KNEE ARTHROSCOPY Right 07/28/2017   TONSILLECTOMY AND ADENOIDECTOMY     TUBAL LIGATION Bilateral 11/1994   WISDOM TOOTH EXTRACTION  age 52   and removal of 4 other teeth - braces X 2     Current Outpatient Medications  Medication Sig Dispense Refill   ACETAMINOPHEN PO Take by mouth as needed.     Ashwagandha 500 MG CAPS Take 1,000 mg by mouth daily.     B Complex-C (SUPER B COMPLEX PO) Take by mouth daily.     baclofen (LIORESAL) 10 MG tablet Take 10 mg by mouth 3 (three) times daily as needed for muscle spasms.     buPROPion (WELLBUTRIN XL) 150 MG 24 hr tablet Take by mouth.     calcium carbonate (OS-CAL) 600 MG TABS Take 600 mg by mouth daily.      Cholecalciferol (VITAMIN D3) 1000 units CAPS Take 1 capsule by mouth 2 (two) times daily.      diclofenac sodium (VOLTAREN) 1 % GEL diclofenac 1 % topical gel     DOXYCYCLINE PO Take by mouth 2 (two) times daily. 2x a day for 10 day.     FLUoxetine (PROZAC) 10 MG tablet Take 1 tablet of 10 mg twice a day 180 tablet 3   glucosamine-chondroitin 500-400 MG tablet Take 1 tablet by mouth daily.  levocetirizine (XYZAL) 5 MG tablet Take 1 tablet (5 mg total) by mouth every evening. 30 tablet 0   magnesium gluconate (MAGONATE) 500 MG tablet Take 500 mg by mouth daily.      No current facility-administered medications for this visit.    Family History  Problem Relation Age of Onset   Atrial fibrillation Mother    Hypertension Mother    Diabetes Mother    Heart disease Mother    Thyroid disease Mother    Hyperlipidemia Mother    Hyperlipidemia Father    Stroke Maternal Grandmother    Stroke Maternal Grandfather    COPD Paternal Grandfather    Diabetes Maternal Aunt    Diabetes Maternal Aunt    Breast cancer Cousin     Review of Systems  Genitourinary:  Positive for vaginal pain.  All other systems reviewed and are negative.   Exam:   BP 124/82   Pulse 66   Ht 5' 6.5" (1.689 m)   Wt 206 lb (93.4 kg)   LMP 12/21/2013 (Exact Date)   SpO2 100%   BMI 32.75 kg/m   Weight change: @WEIGHTCHANGE @ Height:   Height: 5' 6.5" (168.9 cm)  Ht Readings from Last 3 Encounters:  09/16/22 5' 6.5" (1.689 m)  09/08/22 5'  7" (1.702 m)  08/12/22 5\' 7"  (1.702 m)    General appearance: alert, cooperative and appears stated age Head: Normocephalic, without obvious abnormality, atraumatic Neck: no adenopathy, supple, symmetrical, trachea midline and thyroid normal to inspection and palpation Lungs: clear to auscultation bilaterally Cardiovascular: regular rate and rhythm Breasts: normal appearance, no masses or tenderness Abdomen: soft, non-tender; non distended,  no masses,  no organomegaly Extremities: extremities normal, atraumatic, no cyanosis or edema Skin: Skin color, texture, turgor normal. No rashes or lesions Lymph nodes: Cervical, supraclavicular, and axillary nodes normal. No abnormal inguinal nodes palpated Neurologic: Grossly normal   Pelvic: External genitalia:  no lesions              Urethra:  normal appearing urethra with no masses, tenderness or lesions              Bartholins and Skenes: normal                 Vagina: normal appearing vagina with normal color and discharge, no lesions. Not very atrophic, not restricted.               Cervix: no lesions               Bimanual Exam:  Uterus:   no masses or tenderness              Adnexa: no mass, fullness, tenderness               Rectovaginal: Confirms               Anus:  normal sphincter tone, no lesions  Gae Dry chaperoned for the exam.  1. Well woman exam Discussed breast self exam Discussed calcium and vit D intake Mammogram and colon cancer screening UTD  2. Screening for cervical cancer - Cytology - PAP  3. Dyspareunia, female No significant atrophy or restriction. Given samples of uberlube. She should control the rate and depth of penetration.

## 2022-09-17 LAB — CYTOLOGY - PAP
Comment: NEGATIVE
Diagnosis: UNDETERMINED — AB
High risk HPV: NEGATIVE

## 2022-09-26 NOTE — Progress Notes (Unsigned)
Chief Complaint:   OBESITY Katherine Adams is here to discuss her progress with her obesity treatment plan along with follow-up of her obesity related diagnoses. Katherine Adams is on practicing portion control and making smarter food choices, such as increasing vegetables and decreasing simple carbohydrates and states she is following her eating plan approximately 5% of the time. Katherine Adams states she is walking 15-20 minutes 2-3 times per week.  Today's visit was #: 23 Starting weight: 218 lbs Starting date: 03/03/2021 Today's weight: 202 lbs Today's date: 09/08/2022 Total lbs lost to date: 16 Total lbs lost since last in-office visit: 3  Interim History: Katherine Adams had a GI illness for a couple of days over the holidays. She couldn't eat much for several days, but has been feeling back to normal for 5-6 days. Katherine Adams's estranged son, Katherine Adams, recently notified the family that they are pregnant an due early June. Pt wishes to get in shape to play with her grandchild in the future.  Subjective:   1. Seasonal allergies Katherine Adams notes runny nose, watery eyes, scratchy throat, and head pressure when she is off her meds. Pt requests refill. Symptoms are well controlled with Xyzal and she is tolerating it well.  2. At risk for coping difficulty Katherine Adams is at risk for coping difficulty due to being reacquainted with her estranged son, and the stress it may cause pt and family like in the past.  Assessment/Plan:  No orders of the defined types were placed in this encounter.   Medications Discontinued During This Encounter  Medication Reason   levocetirizine (XYZAL) 5 MG tablet Reorder     Meds ordered this encounter  Medications   levocetirizine (XYZAL) 5 MG tablet    Sig: Take 1 tablet (5 mg total) by mouth every evening.    Dispense:  30 tablet    Refill:  0    30 d supply;  ** OV for RF **   Do not send RF request     1. Seasonal allergies Continue preventative strategies.  Continue to decrease inflammation response via  high anti-oxidant diet and exercise. Refill- levocetirizine (XYZAL) 5 MG tablet; Take 1 tablet (5 mg total) by mouth every evening.  Dispense: 30 tablet; Refill: 0  2. At risk for coping difficulty Katherine Adams was given approximately 12 minutes of coping difficulty risk counseling today. She has risk factors for coping difficulty including being reacquainted with her estranged son and stress it may cause patient and family like it had in the past.   3. Obesity, Current BMI 31.7 Katherine Adams is currently in the action stage of change. As such, her goal is to continue with weight loss efforts. She has agreed to the Category 1 Plan and practicing portion control and making smarter food choices, such as increasing vegetables and decreasing simple carbohydrates.   Drink 80oz of water per day.  Exercise goals:  Katherine Adams will walking 20+ minutes 3 days a week.  Behavioral modification strategies: increasing water intake, decreasing sodium intake, and planning for success.  Katherine Adams has agreed to follow-up with our clinic in 3 weeks. She was informed of the importance of frequent follow-up visits to maximize her success with intensive lifestyle modifications for her multiple health conditions.   Objective:   Blood pressure 105/75, pulse 68, temperature 98.1 F (36.7 C), height 5\' 7"  (1.702 m), weight 202 lb 6.4 oz (91.8 kg), last menstrual period 12/21/2013, SpO2 98 %. Body mass index is 31.7 kg/m.  General: Cooperative, alert, well developed, in no acute  distress. HEENT: Conjunctivae and lids unremarkable. Cardiovascular: Regular rhythm.  Lungs: Normal work of breathing. Neurologic: No focal deficits.   Lab Results  Component Value Date   CREATININE 0.88 03/29/2022   BUN 18 03/29/2022   NA 139 03/29/2022   K 4.6 03/29/2022   CL 101 03/29/2022   CO2 24 03/29/2022   Lab Results  Component Value Date   ALT 19 03/29/2022   AST 28 03/29/2022   ALKPHOS 75 03/29/2022   BILITOT 0.3 03/29/2022   Lab Results   Component Value Date   HGBA1C 5.6 03/29/2022   HGBA1C 5.6 11/12/2021   HGBA1C 5.5 07/15/2021   HGBA1C 6.1 (H) 03/03/2021   Lab Results  Component Value Date   INSULIN 7.2 03/29/2022   INSULIN 4.7 11/12/2021   INSULIN 8.5 03/03/2021   Lab Results  Component Value Date   TSH 1.480 03/29/2022   Lab Results  Component Value Date   CHOL 231 (H) 03/29/2022   HDL 72 03/29/2022   LDLCALC 148 (H) 03/29/2022   TRIG 65 03/29/2022   CHOLHDL 3.1 11/12/2021   Lab Results  Component Value Date   VD25OH 53.9 03/29/2022   VD25OH 57.9 11/12/2021   VD25OH 65.6 03/03/2021   Lab Results  Component Value Date   WBC 9.2 03/03/2021   HGB 14.7 03/03/2021   HCT 46.2 03/03/2021   MCV 90 03/03/2021   PLT 285 03/03/2021   Attestation Statements:   Reviewed by clinician on day of visit: allergies, medications, problem list, medical history, surgical history, family history, social history, and previous encounter notes.  I, Kathlene November, BS, CMA, am acting as transcriptionist for [employer], DO.   I have reviewed the above documentation for accuracy and completeness, and I agree with the above. Marjory Sneddon, D.O.  The [place] was signed into law in 2016 which includes the topic of electronic health records.  This provides immediate access to information in MyChart.  This includes consultation notes, operative notes, office notes, lab results and pathology reports.  If you have any questions about what you read please let us know at your next visit so we can discuss your concerns and take corrective action if need be.  We are right here with you.

## 2022-10-11 ENCOUNTER — Encounter (INDEPENDENT_AMBULATORY_CARE_PROVIDER_SITE_OTHER): Payer: Self-pay | Admitting: Family Medicine

## 2022-10-11 ENCOUNTER — Ambulatory Visit (INDEPENDENT_AMBULATORY_CARE_PROVIDER_SITE_OTHER): Payer: Managed Care, Other (non HMO) | Admitting: Family Medicine

## 2022-10-11 VITALS — BP 116/69 | HR 60 | Temp 98.0°F | Ht 67.0 in | Wt 205.0 lb

## 2022-10-11 DIAGNOSIS — J302 Other seasonal allergic rhinitis: Secondary | ICD-10-CM | POA: Diagnosis not present

## 2022-10-11 DIAGNOSIS — R7303 Prediabetes: Secondary | ICD-10-CM

## 2022-10-11 DIAGNOSIS — E559 Vitamin D deficiency, unspecified: Secondary | ICD-10-CM | POA: Diagnosis not present

## 2022-10-11 DIAGNOSIS — E66811 Obesity, class 1: Secondary | ICD-10-CM

## 2022-10-11 DIAGNOSIS — E7849 Other hyperlipidemia: Secondary | ICD-10-CM | POA: Diagnosis not present

## 2022-10-11 DIAGNOSIS — Z6832 Body mass index (BMI) 32.0-32.9, adult: Secondary | ICD-10-CM

## 2022-10-11 DIAGNOSIS — E669 Obesity, unspecified: Secondary | ICD-10-CM

## 2022-10-11 MED ORDER — LEVOCETIRIZINE DIHYDROCHLORIDE 5 MG PO TABS: 5.0000 mg | ORAL_TABLET | Freq: Every evening | ORAL | 0 refills | Status: DC

## 2022-10-12 LAB — COMPREHENSIVE METABOLIC PANEL
ALT: 21 IU/L (ref 0–32)
AST: 26 IU/L (ref 0–40)
Albumin/Globulin Ratio: 2.1 (ref 1.2–2.2)
Albumin: 4.6 g/dL (ref 3.9–4.9)
Alkaline Phosphatase: 84 IU/L (ref 44–121)
BUN/Creatinine Ratio: 22 (ref 12–28)
BUN: 21 mg/dL (ref 8–27)
Bilirubin Total: 0.4 mg/dL (ref 0.0–1.2)
CO2: 24 mmol/L (ref 20–29)
Calcium: 9.4 mg/dL (ref 8.7–10.3)
Chloride: 102 mmol/L (ref 96–106)
Creatinine, Ser: 0.97 mg/dL (ref 0.57–1.00)
Globulin, Total: 2.2 g/dL (ref 1.5–4.5)
Glucose: 72 mg/dL (ref 70–99)
Potassium: 4.8 mmol/L (ref 3.5–5.2)
Sodium: 141 mmol/L (ref 134–144)
Total Protein: 6.8 g/dL (ref 6.0–8.5)
eGFR: 66 mL/min/{1.73_m2} (ref >59)

## 2022-10-12 LAB — VITAMIN D 25 HYDROXY (VIT D DEFICIENCY, FRACTURES): Vit D, 25-Hydroxy: 56.6 ng/mL (ref 30.0–100.0)

## 2022-10-12 LAB — INSULIN, RANDOM: INSULIN: 5.1 u[IU]/mL (ref 2.6–24.9)

## 2022-10-12 LAB — LIPID PANEL WITH LDL/HDL RATIO
Cholesterol, Total: 212 mg/dL — ABNORMAL HIGH (ref 100–199)
HDL: 72 mg/dL (ref >39)
LDL Chol Calc (NIH): 131 mg/dL — ABNORMAL HIGH (ref 0–99)
LDL/HDL Ratio: 1.8 ratio (ref 0.0–3.2)
Triglycerides: 52 mg/dL (ref 0–149)
VLDL Cholesterol Cal: 9 mg/dL (ref 5–40)

## 2022-10-12 LAB — HEMOGLOBIN A1C
Est. average glucose Bld gHb Est-mCnc: 117 mg/dL
Hgb A1c MFr Bld: 5.7 % — ABNORMAL HIGH (ref 4.8–5.6)

## 2022-10-21 NOTE — Progress Notes (Signed)
Chief Complaint:   OBESITY Katherine Adams is here to discuss her progress with her obesity treatment plan along with follow-up of her obesity related diagnoses. Katherine Adams is on the Category 1 Plan and states she is following her eating plan approximately 30% of the time. Katherine Adams states she is walking the stairs 10-20 minutes 1-3 times per week.  Today's visit was #: 24 Starting weight: 218 lbs Starting date: 03/03/2021 Today's weight: 205 lbs Today's date: 10/11/2022 Total lbs lost to date: 13 lbs Total lbs lost since last in-office visit: +3 lbs  Interim History: Patients husband had surgery three weeks ago.  Patient has been taking care of hi mAnd not herself.  They have had increased stress.  Breakfast and lunch is good but dinner has been challenging.  Subjective:   1. Vitamin D deficiency Vitamin D last checked 6 months ago was 53.9.  Patient is taking 1000 IU of vitamin D twice daily.  2. Seasonal allergies Patient is having some postnasal drip in the mornings lately.   3. Prediabetes Patient denies concerns today.  Patient has more stress eating lately and not focused on proteins.  4. Other hyperlipidemia LDL 148.  No medication.  Patient has been trying to decrease saturated and trans fats.  Assessment/Plan:   Orders Placed This Encounter  Procedures   Comprehensive metabolic panel   Hemoglobin A1c   Insulin, random   Lipid Panel With LDL/HDL Ratio   VITAMIN D 25 Hydroxy (Vit-D Deficiency, Fractures)    Medications Discontinued During This Encounter  Medication Reason   levocetirizine (XYZAL) 5 MG tablet Reorder     Meds ordered this encounter  Medications   levocetirizine (XYZAL) 5 MG tablet    Sig: Take 1 tablet (5 mg total) by mouth every evening.    Dispense:  30 tablet    Refill:  0    30 d supply;  ** OV for RF **   Do not send RF request     1. Vitamin D deficiency Check labs today.  Continue OTC supplement.  - VITAMIN D 25 Hydroxy (Vit-D Deficiency,  Fractures)  2. Seasonal allergies Add sinus rinses nightly.  Refill - levocetirizine (XYZAL) 5 MG tablet; Take 1 tablet (5 mg total) by mouth every evening.  Dispense: 30 tablet; Refill: 0  3. Prediabetes Check labs today.  Patient is not taking any medication.  Continue PNP on weight loss.  - Hemoglobin A1c - Insulin, random  4. Other hyperlipidemia Cardiovascular risk and specific lipid/LDL goals reviewed.  We discussed several lifestyle modifications today and Katherine Adams will continue to work on diet, exercise and weight loss efforts. Orders and follow up as documented in patient record.   Counseling Intensive lifestyle modifications are the first line treatment for this issue. Dietary changes: Increase soluble fiber. Decrease simple carbohydrates. Exercise changes: Moderate to vigorous-intensity aerobic activity 150 minutes per week if tolerated. Lipid-lowering medications: see documented in medical record. Check labs today.  - Comprehensive metabolic panel - Lipid Panel With LDL/HDL Ratio  5. Obesity, Current BMI 32.1(start bmi 34.14) Obtain labs today and will go over at next office visit.  Patient's goal is 10 minutes of walking daily minimal and 80 ounces of water per day.  Katherine Adams is currently in the action stage of change. As such, her goal is to continue with weight loss efforts. She has agreed to the Category 1 Plan with breakfast and lunch options.  Exercise goals:  As above.  Behavioral modification strategies: meal planning and  cooking strategies, avoiding temptations, and planning for success.  Katherine Adams has agreed to follow-up with our clinic in 3 weeks. She was informed of the importance of frequent follow-up visits to maximize her success with intensive lifestyle modifications for her multiple health conditions.   Objective:   Blood pressure 116/69, pulse 60, temperature 98 F (36.7 C), height '5\' 7"'$  (1.702 m), weight 205 lb (93 kg), last menstrual period 12/21/2013, SpO2 99  %. Body mass index is 32.11 kg/m.  General: Cooperative, alert, well developed, in no acute distress. HEENT: Conjunctivae and lids unremarkable. Cardiovascular: Regular rhythm.  Lungs: Normal work of breathing. Neurologic: No focal deficits.   Lab Results  Component Value Date   CREATININE 0.97 10/11/2022   BUN 21 10/11/2022   NA 141 10/11/2022   K 4.8 10/11/2022   CL 102 10/11/2022   CO2 24 10/11/2022   Lab Results  Component Value Date   ALT 21 10/11/2022   AST 26 10/11/2022   ALKPHOS 84 10/11/2022   BILITOT 0.4 10/11/2022   Lab Results  Component Value Date   HGBA1C 5.7 (H) 10/11/2022   HGBA1C 5.6 03/29/2022   HGBA1C 5.6 11/12/2021   HGBA1C 5.5 07/15/2021   HGBA1C 6.1 (H) 03/03/2021   Lab Results  Component Value Date   INSULIN 5.1 10/11/2022   INSULIN 7.2 03/29/2022   INSULIN 4.7 11/12/2021   INSULIN 8.5 03/03/2021   Lab Results  Component Value Date   TSH 1.480 03/29/2022   Lab Results  Component Value Date   CHOL 212 (H) 10/11/2022   HDL 72 10/11/2022   LDLCALC 131 (H) 10/11/2022   TRIG 52 10/11/2022   CHOLHDL 3.1 11/12/2021   Lab Results  Component Value Date   VD25OH 56.6 10/11/2022   VD25OH 53.9 03/29/2022   VD25OH 57.9 11/12/2021   Lab Results  Component Value Date   WBC 9.2 03/03/2021   HGB 14.7 03/03/2021   HCT 46.2 03/03/2021   MCV 90 03/03/2021   PLT 285 03/03/2021   No results found for: "IRON", "TIBC", "FERRITIN"  Attestation Statements:   Reviewed by clinician on day of visit: allergies, medications, problem list, medical history, surgical history, family history, social history, and previous encounter notes.  I, Davy Pique, RMA, am acting as Location manager for [employer], DO.   I have reviewed the above documentation for accuracy and completeness, and I agree with the above. Marjory Sneddon, D.O.  The [place] was signed into law in 2016 which includes the topic of electronic health records.  This  provides immediate access to information in MyChart.  This includes consultation notes, operative notes, office notes, lab results and pathology reports.  If you have any questions about what you read please let us know at your next visit so we can discuss your concerns and take corrective action if need be.  We are right here with you.

## 2022-11-01 ENCOUNTER — Ambulatory Visit (INDEPENDENT_AMBULATORY_CARE_PROVIDER_SITE_OTHER): Payer: Managed Care, Other (non HMO) | Admitting: Family Medicine

## 2022-11-03 ENCOUNTER — Ambulatory Visit (INDEPENDENT_AMBULATORY_CARE_PROVIDER_SITE_OTHER): Payer: Managed Care, Other (non HMO) | Admitting: Family Medicine

## 2022-11-03 ENCOUNTER — Encounter (INDEPENDENT_AMBULATORY_CARE_PROVIDER_SITE_OTHER): Payer: Self-pay | Admitting: Family Medicine

## 2022-11-03 VITALS — BP 113/73 | HR 67 | Temp 98.0°F | Ht 67.0 in | Wt 203.0 lb

## 2022-11-03 DIAGNOSIS — J302 Other seasonal allergic rhinitis: Secondary | ICD-10-CM | POA: Diagnosis not present

## 2022-11-03 DIAGNOSIS — E559 Vitamin D deficiency, unspecified: Secondary | ICD-10-CM | POA: Diagnosis not present

## 2022-11-03 DIAGNOSIS — E7849 Other hyperlipidemia: Secondary | ICD-10-CM

## 2022-11-03 DIAGNOSIS — Z6831 Body mass index (BMI) 31.0-31.9, adult: Secondary | ICD-10-CM

## 2022-11-03 DIAGNOSIS — E669 Obesity, unspecified: Secondary | ICD-10-CM

## 2022-11-03 DIAGNOSIS — R7303 Prediabetes: Secondary | ICD-10-CM

## 2022-11-03 MED ORDER — LEVOCETIRIZINE DIHYDROCHLORIDE 5 MG PO TABS: 5.0000 mg | ORAL_TABLET | Freq: Every evening | ORAL | 0 refills | Status: DC

## 2022-11-16 NOTE — Progress Notes (Unsigned)
Chief Complaint:   OBESITY Katherine Adams is here to discuss her progress with her obesity treatment plan along with follow-up of her obesity related diagnoses. Katherine Adams is on the Category 1 Plan with breakfast and lunch options and states she is following her eating plan approximately 30% of the time. Katherine Adams states she is walking for 15-20 minutes 3 times per week.  Today's visit was #: 25 Starting weight: 218 lbs Starting date: 03/03/2021 Today's weight: 203 lbs Today's date: 11/03/2022 Total lbs lost to date: 15 Total lbs lost since last in-office visit: 2  Interim History: Katherine Adams continues to do well with weight loss.  She is working on increasing her protein, especially in the morning.  Her hunger is controlled and appropriate.  She is doing well with increasing her water intake.  Subjective:   1. Other hyperlipidemia Katherine Adams's LDL is improving, but is not yet at goal.  She is not on a statin.  I discussed labs with the patient today.  2. Vitamin D deficiency Katherine Adams's labs were reviewed today, and her vitamin D level is at goal.  She has no signs of over replacement.  3. Prediabetes Katherine Adams's A1c is slightly elevated at 5.7.  She is working on decreasing simple carbohydrates.  I discussed labs with the patient today.  4. Seasonal allergies Katherine Adams's symptoms are worsening this spring, and she requests a refill today.  Assessment/Plan:   1. Other hyperlipidemia Katherine Adams will continue to work on decreasing cholesterol in her diet, and we will continue to monitor her progress and manage her condition.  2. Vitamin D deficiency Katherine Adams will continue vitamin D OTC, and we will continue to monitor.  3. Prediabetes Katherine Adams will continue with her diet and exercise, and we will recheck labs in 3 months.  4. Seasonal allergies Katherine Adams will continue Xyzal, and we will refill for 90 days.  - levocetirizine (XYZAL) 5 MG tablet; Take 1 tablet (5 mg total) by mouth every evening.  Dispense: 90 tablet; Refill: 0  5. BMI  31.0-31.9,adult  6. Obesity, Beginning BMI 34.14 Katherine Adams is currently in the action stage of change. As such, her goal is to continue with weight loss efforts. She has agreed to the Category 1 Plan.   Exercise goals: As is.   Behavioral modification strategies: increasing lean protein intake.  Katherine Adams has agreed to follow-up with our clinic in 3 to 4 weeks. She was informed of the importance of frequent follow-up visits to maximize her success with intensive lifestyle modifications for her multiple health conditions.   Objective:   Blood pressure 113/73, pulse 67, temperature 98 F (36.7 C), height 5\' 7"  (1.702 m), weight 203 lb (92.1 kg), last menstrual period 12/21/2013, SpO2 98 %. Body mass index is 31.79 kg/m.  Lab Results  Component Value Date   CREATININE 0.97 10/11/2022   BUN 21 10/11/2022   NA 141 10/11/2022   K 4.8 10/11/2022   CL 102 10/11/2022   CO2 24 10/11/2022   Lab Results  Component Value Date   ALT 21 10/11/2022   AST 26 10/11/2022   ALKPHOS 84 10/11/2022   BILITOT 0.4 10/11/2022   Lab Results  Component Value Date   HGBA1C 5.7 (H) 10/11/2022   HGBA1C 5.6 03/29/2022   HGBA1C 5.6 11/12/2021   HGBA1C 5.5 07/15/2021   HGBA1C 6.1 (H) 03/03/2021   Lab Results  Component Value Date   INSULIN 5.1 10/11/2022   INSULIN 7.2 03/29/2022   INSULIN 4.7 11/12/2021   INSULIN 8.5 03/03/2021  Lab Results  Component Value Date   TSH 1.480 03/29/2022   Lab Results  Component Value Date   CHOL 212 (H) 10/11/2022   HDL 72 10/11/2022   LDLCALC 131 (H) 10/11/2022   TRIG 52 10/11/2022   CHOLHDL 3.1 11/12/2021   Lab Results  Component Value Date   VD25OH 56.6 10/11/2022   VD25OH 53.9 03/29/2022   VD25OH 57.9 11/12/2021   Lab Results  Component Value Date   WBC 9.2 03/03/2021   HGB 14.7 03/03/2021   HCT 46.2 03/03/2021   MCV 90 03/03/2021   PLT 285 03/03/2021   No results found for: "IRON", "TIBC", "FERRITIN"  Attestation Statements:   Reviewed by  clinician on day of visit: allergies, medications, problem list, medical history, surgical history, family history, social history, and previous encounter notes.   I, Trixie Dredge, am acting as transcriptionist for Dennard Nip, MD.  I have reviewed the above documentation for accuracy and completeness, and I agree with the above. -  Dennard Nip, MD

## 2022-12-02 ENCOUNTER — Encounter (INDEPENDENT_AMBULATORY_CARE_PROVIDER_SITE_OTHER): Payer: Self-pay | Admitting: Family Medicine

## 2022-12-02 ENCOUNTER — Ambulatory Visit (INDEPENDENT_AMBULATORY_CARE_PROVIDER_SITE_OTHER): Payer: Managed Care, Other (non HMO) | Admitting: Family Medicine

## 2022-12-02 VITALS — BP 136/85 | HR 69 | Temp 98.9°F | Ht 67.0 in | Wt 207.0 lb

## 2022-12-02 DIAGNOSIS — E559 Vitamin D deficiency, unspecified: Secondary | ICD-10-CM

## 2022-12-02 DIAGNOSIS — E7849 Other hyperlipidemia: Secondary | ICD-10-CM

## 2022-12-02 DIAGNOSIS — R7303 Prediabetes: Secondary | ICD-10-CM

## 2022-12-02 DIAGNOSIS — J302 Other seasonal allergic rhinitis: Secondary | ICD-10-CM

## 2022-12-02 DIAGNOSIS — Z6832 Body mass index (BMI) 32.0-32.9, adult: Secondary | ICD-10-CM

## 2022-12-02 DIAGNOSIS — E669 Obesity, unspecified: Secondary | ICD-10-CM

## 2022-12-02 DIAGNOSIS — Z9189 Other specified personal risk factors, not elsewhere classified: Secondary | ICD-10-CM | POA: Insufficient documentation

## 2022-12-02 MED ORDER — LEVOCETIRIZINE DIHYDROCHLORIDE 5 MG PO TABS: 5.0000 mg | ORAL_TABLET | Freq: Every evening | ORAL | 0 refills | Status: DC

## 2022-12-02 NOTE — Progress Notes (Signed)
Katherine Adams, D.O.  ABFM, ABOM Specializing in Clinical Bariatric Medicine  Office located at: [address],   [address]     Assessment and Plan:   Medications Discontinued During This Encounter  Medication Reason   DOXYCYCLINE PO Completed Course   levocetirizine (XYZAL) 5 MG tablet Reorder     Meds ordered this encounter  Medications   levocetirizine (XYZAL) 5 MG tablet    Sig: Take 1 tablet (5 mg total) by mouth every evening.    Dispense:  90 tablet    Refill:  0    Obesity, Current BMI 32.4 (start bmi 34.14/date 03/03/21) Assessment: Condition is Not at goal.. Biometric data collected today, was reviewed with patient.  Fat mass has increased by 1.6lb. Muscle mass has increased by 2.2lb. Total body water has increased by .2lb.   Plan: Continue category 1 meal plan  -Patient provided with portion control and smart choices packet.  -Continue Wellbutrin XL 150 mg once daily.   Prediabetes Assessment: Condition is Improving, but not optimized.. Labs were reviewed.  Lab Results  Component Value Date   HGBA1C 5.7 (H) 10/11/2022   HGBA1C 5.6 03/29/2022   HGBA1C 5.6 11/12/2021   INSULIN 5.1 10/11/2022   INSULIN 7.2 03/29/2022   INSULIN 4.7 11/12/2021     Plan:Patient denies concerns today. - Stressed importance of dietary and lifestyle modifications to result in weight loss as first line txmnt - In addition, we discussed the risks and benefits of various medication options which can help [place] in the management of this disease process as well as with weight loss.  Will consider starting one of these meds in future as we will focus on prudent nutritional plan at this time.  - Continue to decrease simple carbs/ sugars; increase fiber and proteins -> follow her meal plan.   - Explained role of simple carbs and insulin levels on hunger and cravings - Handouts provided at pt's request after education provided.  All concerns/questions addressed.   -  Anticipatory guidance given.   Katherine Adams will continue to work on weight loss, exercise, via their meal plan we devised to help decrease the risk of progressing to diabetes.  - We will recheck A1c and fasting insulin level in approximately 3 months from last check, or as deemed appropriate.    Other hyperlipidemia Assessment: Condition is Improving, but not optimized.. Labs were reviewed. Lab Results  Component Value Date   CHOL 212 (H) 10/11/2022   HDL 72 10/11/2022   LDLCALC 131 (H) 10/11/2022   TRIG 52 10/11/2022   CHOLHDL 3.1 11/12/2021    Plan: Patient has been trying to decrease saturated and trans fats.  Katherine Adams agrees to continue with meds and/or our treatment plan of a heart-heathy, low cholesterol meal plan - We recommend: aerobic activity with eventual goal of a minimum of 150+ min wk plus 2 days/ week of resistance or strength training.   - Cardiovascular risk and specific lipid/LDL goals reviewed. - We extensively discussed several lifestyle modifications today and she will continue to work on diet, exercise and weight loss efforts.  - I stressed the importance that patient continue with our prudent nutritional plan that is low in saturated and trans fats, and low in fatty carbs to improve these numbers.   - We will continue routine screening as patient continues to achieve health goals along their weight loss journey Last lipid panel as above.    Vitamin D deficiency Assessment: Condition is  At goal.. Labs were reviewed.  Lab Results  Component Value Date   VD25OH 56.6 10/11/2022   VD25OH 53.9 03/29/2022   VD25OH 57.9 11/12/2021    Plan:Continue 1000 IU of cholecalciferol twice daily.  - I discussed the importance of vitamin D to the patient's health and well-being.  - I reviewed possible symptoms of low Vitamin D:  low energy, depressed mood, muscle aches, joint aches, osteoporosis etc. was reviewed with patient - low Vitamin D levels may be linked to an increased  risk of cardiovascular events and even increased risk of cancers- such as colon and breast.  - ideal vitamin D levels reviewed with patient   - Informed patient this may be a lifelong thing, and she was encouraged to continue to take the medicine until told otherwise.    - weight loss will likely improve availability of vitamin D, thus encouraged Katherine Adams to continue with meal plan and their weight loss efforts to further improve this condition.  Thus, we will need to monitor levels regularly (every 3-4 mo on average) to keep levels within normal limits and prevent over supplementation. - pt's questions and concerns regarding this condition addressed.   Seasonal Allergies Assessment: Condition is Improving on the medication.  Symptoms are well controlled with Xyzal. Patient reports compliance with medications and good tolerance.  Plan: Continue current regimen. Will refill Xyzal today.     TREATMENT PLAN FOR OBESITY:  Recommended Dietary Goals Katherine Adams is currently in the action stage of change. As such, her goal is to continue weight management plan. She has agreed to continue the Category 1 Plan. Provided portion control smart choices handouts.   Behavioral Intervention Additional resources provided today: Category 1 meal plan with portion control smart choices.  Evidence-based interventions for health behavior change were utilized today including the discussion of self monitoring techniques, problem-solving barriers and SMART goal setting techniques.   Regarding patient's less desirable eating habits and patterns, we employed the technique of small changes.  Pt will specifically work on: increase protein intake for next visit.    Recommended Physical Activity Goals Katherine Adams has been advised to work up to 150 minutes of moderate intensity aerobic activity a week and strengthening exercises 2-3 times per week for cardiovascular health, weight loss maintenance and preservation of muscle mass.  She has  agreed to Continue current level of physical activity    FOLLOW UP: Return in about 4 months (around 04/03/2023).; per pt request.    She was informed of the importance of frequent follow up visits to maximize her success with intensive lifestyle modifications for her multiple health conditions.    Subjective:   Chief complaint: Obesity Katherine Adams is here to discuss her progress with her obesity treatment plan. She is on the the Category 1 Plan and states she is following her eating plan approximately 50% of the time. She states she is exercising 30 minutes  3-4 days per week.  Interval History:  Katherine Adams is here for a follow up office visit. We reviewed her meal plan and all questions were answered. Patient's food recall appears to be accurate and consistent with what is on plan when she is following it. When eating on plan, her hunger and cravings are well controlled.     Since last office visit she been doing well. She states that she wants to withdraw from the program but is willing to follow up every 6 months. She reports that her mother has been recently diagnosed with Alzheimer's and would  like to spend more time with her family. She reports that her relationship with her son has been improving. She states she's been walking more and is more mindful of her food choices. She states she's been increasing her water intake as previously advised.   Pharmacotherapy for weight loss: She is not currently taking medications  for medical weight loss.  Review of Systems:  Pertinent positives were addressed with patient today.    Weight Summary and Biometrics   Weight Lost Since Last Visit: 0  Weight Gained Since Last Visit: 4lb    Vitals Temp: 98.9 F (37.2 C) BP: 136/85 Pulse Rate: 69 SpO2: 96 %   Anthropometric Measurements Height: 5\' 7"  (1.702 m) Weight: 207 lb (93.9 kg) BMI (Calculated): 32.41 Weight at Last Visit: 203lb Weight Lost Since Last Visit: 0 Weight Gained Since Last  Visit: 4lb Starting Weight: 218lb Total Weight Loss (lbs): 11 lb (4.99 kg) Peak Weight: 218lb   Body Composition  Body Fat %: 43.8 % Fat Mass (lbs): 90.6 lbs Muscle Mass (lbs): 110.6 lbs Total Body Water (lbs): 78.4 lbs Visceral Fat Rating : 12   Other Clinical Data Fasting: no Labs: no Today's Visit #: 26 Starting Date: 03/03/21     Objective:   PHYSICAL EXAM:  Blood pressure 136/85, pulse 69, temperature 98.9 F (37.2 C), height 5\' 7"  (1.702 m), weight 207 lb (93.9 kg), last menstrual period 12/21/2013, SpO2 96 %. Body mass index is 32.42 kg/m.  General: Well Developed, well nourished, and in no acute distress.  HEENT: Normocephalic, atraumatic Skin: Warm and dry, cap RF less 2 sec, good turgor Chest:  Normal excursion, shape, no gross abn Respiratory: speaking in full sentences, no conversational dyspnea NeuroM-Sk: Ambulates w/o assistance, moves * 4 Psych: A and O *3, insight good, mood-full  DIAGNOSTIC DATA REVIEWED:  BMET    Component Value Date/Time   NA 141 10/11/2022 0947   K 4.8 10/11/2022 0947   CL 102 10/11/2022 0947   CO2 24 10/11/2022 0947   GLUCOSE 72 10/11/2022 0947   GLUCOSE 93 01/13/2016 0947   BUN 21 10/11/2022 0947   CREATININE 0.97 10/11/2022 0947   CREATININE 0.87 01/13/2016 0947   CALCIUM 9.4 10/11/2022 0947   GFRNONAA 71 01/11/2018 1605   GFRAA 82 01/11/2018 1605   Lab Results  Component Value Date   HGBA1C 5.7 (H) 10/11/2022   HGBA1C 6.1 (H) 03/03/2021   Lab Results  Component Value Date   INSULIN 5.1 10/11/2022   INSULIN 8.5 03/03/2021   Lab Results  Component Value Date   TSH 1.480 03/29/2022   CBC    Component Value Date/Time   WBC 9.2 03/03/2021 0837   WBC 6.1 01/13/2016 0947   RBC 5.12 03/03/2021 0837   RBC 4.49 01/13/2016 0947   HGB 14.7 03/03/2021 0837   HGB 13.2 01/09/2015 1144   HCT 46.2 03/03/2021 0837   PLT 285 03/03/2021 0837   MCV 90 03/03/2021 0837   MCH 28.7 03/03/2021 0837   MCH 30.3  01/13/2016 0947   MCHC 31.8 03/03/2021 0837   MCHC 32.9 01/13/2016 0947   RDW 14.1 03/03/2021 0837   Iron Studies No results found for: "IRON", "TIBC", "FERRITIN", "IRONPCTSAT" Lipid Panel     Component Value Date/Time   CHOL 212 (H) 10/11/2022 0947   TRIG 52 10/11/2022 0947   HDL 72 10/11/2022 0947   CHOLHDL 3.1 11/12/2021 1120   CHOLHDL 3.0 01/13/2016 0947   VLDL 14 01/13/2016 0947   LDLCALC 131 (H)  10/11/2022 0947   Hepatic Function Panel     Component Value Date/Time   PROT 6.8 10/11/2022 0947   ALBUMIN 4.6 10/11/2022 0947   AST 26 10/11/2022 0947   ALT 21 10/11/2022 0947   ALKPHOS 84 10/11/2022 0947   BILITOT 0.4 10/11/2022 0947      Component Value Date/Time   TSH 1.480 03/29/2022 0855   Nutritional Lab Results  Component Value Date   VD25OH 56.6 10/11/2022   VD25OH 53.9 03/29/2022   VD25OH 57.9 11/12/2021    Attestations:   Reviewed by clinician on day of visit: allergies, medications, problem list, medical history, surgical history, family history, social history, and previous encounter notes.    I,Safa M Kadhim,acting as a scribe for [employer], DO.,have documented all relevant documentation on the behalf of Thomasene Lot, DO,as directed by  Thomasene Lot, DO while in the presence of Thomasene Lot, DO.   I, Thomasene Lot, DO, have reviewed all documentation for this visit. The documentation on 12/02/22 for the exam, diagnosis, procedures, and orders are all accurate and complete.

## 2023-04-07 ENCOUNTER — Ambulatory Visit (INDEPENDENT_AMBULATORY_CARE_PROVIDER_SITE_OTHER): Payer: Managed Care, Other (non HMO) | Admitting: Family Medicine

## 2023-04-07 ENCOUNTER — Encounter (INDEPENDENT_AMBULATORY_CARE_PROVIDER_SITE_OTHER): Payer: Self-pay | Admitting: Family Medicine

## 2023-04-07 VITALS — BP 114/73 | HR 70 | Temp 98.2°F | Ht 67.0 in | Wt 209.0 lb

## 2023-04-07 DIAGNOSIS — E7849 Other hyperlipidemia: Secondary | ICD-10-CM | POA: Diagnosis not present

## 2023-04-07 DIAGNOSIS — E538 Deficiency of other specified B group vitamins: Secondary | ICD-10-CM | POA: Diagnosis not present

## 2023-04-07 DIAGNOSIS — R7303 Prediabetes: Secondary | ICD-10-CM | POA: Diagnosis not present

## 2023-04-07 DIAGNOSIS — E669 Obesity, unspecified: Secondary | ICD-10-CM

## 2023-04-07 DIAGNOSIS — J302 Other seasonal allergic rhinitis: Secondary | ICD-10-CM

## 2023-04-07 DIAGNOSIS — Z6832 Body mass index (BMI) 32.0-32.9, adult: Secondary | ICD-10-CM

## 2023-04-07 DIAGNOSIS — Z6831 Body mass index (BMI) 31.0-31.9, adult: Secondary | ICD-10-CM

## 2023-04-07 DIAGNOSIS — E559 Vitamin D deficiency, unspecified: Secondary | ICD-10-CM

## 2023-04-07 MED ORDER — LEVOCETIRIZINE DIHYDROCHLORIDE 5 MG PO TABS: 5.0000 mg | ORAL_TABLET | Freq: Every evening | ORAL | 0 refills | Status: DC

## 2023-04-07 NOTE — Progress Notes (Signed)
Katherine Adams, D.O.  ABFM, ABOM Specializing in Clinical Bariatric Medicine  Office located at: [address] [place], [place]  [address]     Assessment and Plan:  Recheck labs next OV Orders Placed This Encounter  Procedures   VITAMIN D 25 Hydroxy (Vit-D Deficiency, Fractures)   Vitamin B12   TSH   Hemoglobin A1c   T4, free   Medications Discontinued During This Encounter  Medication Reason   levocetirizine (XYZAL) 5 MG tablet Reorder    Meds ordered this encounter  Medications   levocetirizine (XYZAL) 5 MG tablet    Sig: Take 1 tablet (5 mg total) by mouth every evening.    Dispense:  90 tablet    Refill:  0    Seasonal allergies Assessment: Condition is not optimized. Patient reports running out of her supply and allergies have began to agitate more since being off of it. Symptoms are controlled with Xyzal.    Plan: - Continue Xyzal 5mg . I will refill this today.   Prediabetes Assessment: Condition is Not at goal. Pt has been compliant with Wellbutrin XL and is tolerating this well. During the peak of her husbands surgery she did have carb cravings but since then this has now subsided.  Lab Results  Component Value Date   HGBA1C 5.7 (H) 10/11/2022   HGBA1C 5.6 03/29/2022   HGBA1C 5.6 11/12/2021   INSULIN 5.1 10/11/2022   INSULIN 7.2 03/29/2022   INSULIN 4.7 11/12/2021    Plan: - Continue Wellbutrin XL 150mg  daily. She denies a need for refill.    - I explained role of simple carbs and insulin levels on hunger and cravings  - Anticipatory guidance was given.    - Continue to work on weight loss, exercise, via their meal plan we devised to help decrease the risk of progressing to diabetes.   - We will recheck A1c and fasting insulin level in approximately 3 months from last check, or as deemed appropriate.    Other hyperlipidemia Assessment: Condition is Not at goal.. Her LDL is elevated at 131 as of 10/11/2022. This is diet/exercise controlled.   Lab Results  Component Value Date   CHOL 212 (H) 10/11/2022   HDL 72 10/11/2022   LDLCALC 131 (H) 10/11/2022   TRIG 52 10/11/2022   CHOLHDL 3.1 11/12/2021   Plan: -  Continue her prudent nutritional plan that is low in simple carbohydrates, saturated fats and trans fats to goal of 5-10% weight loss to achieve significant health benefits.  Pt encouraged to continually advance exercise and cardiovascular fitness as tolerated throughout weight loss journey. - We recommend: aerobic activity with eventual goal of a minimum of 150+ min wk plus 2 days/ week of resistance or strength training.     Vitamin D deficiency Assessment: Condition is At goal.. Pt levels are within the normal range for vitamin D. This is well controlled with OTC vitamin D supplement.  Lab Results  Component Value Date   VD25OH 56.6 10/11/2022   VD25OH 53.9 03/29/2022   VD25OH 57.9 11/12/2021   Plan: - Continue with Cholecalciferol 1000lU two times daily.  - Discussed the importance of vitamin D to the patient's health and weight loss will likely improve availability of vitamin D. I encouraged Katherine Adams to continue with meal plan and their weight loss efforts to further improve this condition.    - Thus, we will need to monitor levels regularly every 3-4 mo on average to keep levels within normal limits and  prevent over supplementation.   B12 deficiency due to diet Assessment: Condition is stable Pt B12 level is within the recommended B12 level. Pt has been compliant with OTC B12 supplements.  Lab Results  Component Value Date   VITAMINB12 807 03/03/2021   Plan: - Continue Cyanococalin daily.    Continue their prudent nutritional plan and focus on b12 rich foods such as lean red meats; poultry; eggs; seafood; beans, peas, and lentils; nuts and seeds; and soy products  - We will continue to monitor as deemed clinically necessary.   TREATMENT PLAN FOR OBESITY: BMI 31.0-31.9,adult- current BMI 32.73 Obesity,  Current BMI 32.4 (start bmi 34.14/date 03/03/21) Assessment:  Katherine Adams is here to discuss her progress with her obesity treatment plan along with follow-up of her obesity related diagnoses. See Medical Weight Management Flowsheet for complete bioelectrical impedance results.  Condition is not optimized. Biometric data collected today, was reviewed with patient.   Since last office visit on 12/02/2022 patient's  Muscle mass has decreased by 0.6lb. Fat mass has increased by 3.4lb. Total body water has decreased by 1.2lb.  Counseling done on how various foods will affect these numbers and how to maximize success  Total lbs lost to date: 9lbs Total weight loss percentage to date: -4.13%   Plan: - Continue to follow the prescribe Category 1 meal plan.    Behavioral Intervention Additional resources provided today:  Provided hand out on proper plate portions, healthy complex carbs and sources of protein as well as generaly healthy eating concepts.  Evidence-based interventions for health behavior change were utilized today including the discussion of self monitoring techniques, problem-solving barriers and SMART goal setting techniques.   Regarding patient's less desirable eating habits and patterns, we employed the technique of small changes.  Pt will specifically work on: adhering to the category 1 nutritional plan and begin journaling if she feels as this will help for next visit.    Recommended Physical Activity Goals  Katherine Adams has been advised to slowly work up to 150 minutes of moderate intensity aerobic activity a week and strengthening exercises 2-3 times per week for cardiovascular health, weight loss maintenance and preservation of muscle mass.   She has agreed to Continue current level of physical activity    FOLLOW UP: Return in about 3 months (around 07/08/2023).  She was informed of the importance of frequent follow up visits to maximize her success with intensive lifestyle  modifications for her multiple health conditions. Katherine Adams is aware that we will review all of her lab results at our next visit together in person.  She is aware that if anything is critical/ life threatening with the results, we will be contacting her via MyChart or by my CMA will be calling them prior to the office visit to discuss acute management.    Subjective:   Chief complaint: Obesity Katherine Adams is here to discuss her progress with her obesity treatment plan. She is on the the Category 1 Plan and states she is following her eating plan approximately 25% of the time. She states she is not exercising.  Interval History:  Katherine Adams is here for a follow up office visit. Since last OV she welcomed a new grandchild and her husband has had 2 surgeries this year. Recently in March she has began having trouble with her knee and it was found that she had a tore meniscus and she has a surgery scheduled for next week. Pt also informed me that she broke her toe  recently too. Due to all these complications she has not been able to exercise. She has indulged in some sweets and desserts but has now been trying to make healthier food choices instead of ordering take out.   Pharmacotherapy for weight loss: She is currently taking Wellbutrin for medical weight loss.  Denies side effects.    Review of Systems:  Pertinent positives were addressed with patient today.   Reviewed by clinician on day of visit: allergies, medications, problem list, medical history, surgical history, family history, social history, and previous encounter notes.  Weight Summary and Biometrics   Weight Lost Since Last Visit: 0lb  Weight Gained Since Last Visit: 2lb   Vitals Temp: 98.2 F (36.8 C) BP: 114/73 Pulse Rate: 70 SpO2: 100 %   Anthropometric Measurements Height: 5\' 7"  (1.702 m) Weight: 209 lb (94.8 kg) BMI (Calculated): 32.73 Weight at Last Visit: 207lb Weight Lost Since Last Visit: 0lb Weight Gained Since  Last Visit: 2lb Starting Weight: 218lb Total Weight Loss (lbs): 9 lb (4.082 kg) Peak Weight: 218lb   Body Composition  Body Fat %: 44.8 % Fat Mass (lbs): 94 lbs Muscle Mass (lbs): 110 lbs Total Body Water (lbs): 77.2 lbs Visceral Fat Rating : 12   Other Clinical Data Fasting: no Labs: no Today's Visit #: 27 Starting Date: 03/03/21    Objective:   PHYSICAL EXAM: Blood pressure 114/73, pulse 70, temperature 98.2 F (36.8 C), height 5\' 7"  (1.702 m), weight 209 lb (94.8 kg), last menstrual period 12/21/2013, SpO2 100%. Body mass index is 32.73 kg/m.  General: Well Developed, well nourished, and in no acute distress.  HEENT: Normocephalic, atraumatic Skin: Warm and dry, cap RF less 2 sec, good turgor Chest:  Normal excursion, shape, no gross abn Respiratory: speaking in full sentences, no conversational dyspnea NeuroM-Sk: Ambulates w/o assistance, moves * 4 Psych: A and O *3, insight good, mood-full  DIAGNOSTIC DATA REVIEWED:  BMET    Component Value Date/Time   NA 141 10/11/2022 0947   K 4.8 10/11/2022 0947   CL 102 10/11/2022 0947   CO2 24 10/11/2022 0947   GLUCOSE 72 10/11/2022 0947   GLUCOSE 93 01/13/2016 0947   BUN 21 10/11/2022 0947   CREATININE 0.97 10/11/2022 0947   CREATININE 0.87 01/13/2016 0947   CALCIUM 9.4 10/11/2022 0947   GFRNONAA 71 01/11/2018 1605   GFRAA 82 01/11/2018 1605   Lab Results  Component Value Date   HGBA1C 5.7 (H) 10/11/2022   HGBA1C 6.1 (H) 03/03/2021   Lab Results  Component Value Date   INSULIN 5.1 10/11/2022   INSULIN 8.5 03/03/2021   Lab Results  Component Value Date   TSH 1.480 03/29/2022   CBC    Component Value Date/Time   WBC 9.2 03/03/2021 0837   WBC 6.1 01/13/2016 0947   RBC 5.12 03/03/2021 0837   RBC 4.49 01/13/2016 0947   HGB 14.7 03/03/2021 0837   HGB 13.2 01/09/2015 1144   HCT 46.2 03/03/2021 0837   PLT 285 03/03/2021 0837   MCV 90 03/03/2021 0837   MCH 28.7 03/03/2021 0837   MCH 30.3 01/13/2016  0947   MCHC 31.8 03/03/2021 0837   MCHC 32.9 01/13/2016 0947   RDW 14.1 03/03/2021 0837   Iron Studies No results found for: "IRON", "TIBC", "FERRITIN", "IRONPCTSAT" Lipid Panel     Component Value Date/Time   CHOL 212 (H) 10/11/2022 0947   TRIG 52 10/11/2022 0947   HDL 72 10/11/2022 0947   CHOLHDL 3.1 11/12/2021 1120  CHOLHDL 3.0 01/13/2016 0947   VLDL 14 01/13/2016 0947   LDLCALC 131 (H) 10/11/2022 0947   Hepatic Function Panel     Component Value Date/Time   PROT 6.8 10/11/2022 0947   ALBUMIN 4.6 10/11/2022 0947   AST 26 10/11/2022 0947   ALT 21 10/11/2022 0947   ALKPHOS 84 10/11/2022 0947   BILITOT 0.4 10/11/2022 0947      Component Value Date/Time   TSH 1.480 03/29/2022 0855   Nutritional Lab Results  Component Value Date   VD25OH 56.6 10/11/2022   VD25OH 53.9 03/29/2022   VD25OH 57.9 11/12/2021    Attestations:   I, Clinical biochemist, acting as a Stage manager for [employer], DO., have compiled all relevant documentation for today's office visit on behalf of Thomasene Lot, DO, while in the presence of [employer], DO.  I have reviewed the above documentation for accuracy and completeness, and I agree with the above. Katherine Adams, D.O.  The 21st Century Cures Act was signed into law in 2016 which includes the topic of electronic health records.  This provides immediate access to information in MyChart.  This includes consultation notes, operative notes, office notes, lab results and pathology reports.  If you have any questions about what you read please let us know at your next visit so we can discuss your concerns and take corrective action if need be.  We are right here with you.

## 2023-04-15 IMAGING — MG DIGITAL DIAGNOSTIC BILAT W/ TOMO W/ CAD
8 series · 8 of 24 positions shown · non-contrast
Comparison: Previous exam(s).

CLINICAL DATA: Two year interval follow-up of a likely benign mass
involving the LOWER OUTER QUADRANT of the LEFT breast, possibly a
fibroadenoma. Annual evaluation, RIGHT breast.

EXAM:
DIGITAL DIAGNOSTIC BILATERAL MAMMOGRAM WITH TOMOSYNTHESIS AND CAD;
ULTRASOUND LEFT BREAST LIMITED
TECHNIQUE: Bilateral digital diagnostic mammography and breast tomosynthesis
was performed. The images were evaluated with computer-aided
detection.; Targeted ultrasound examination of the left breast was
performed.

[R MLO synth-2D]
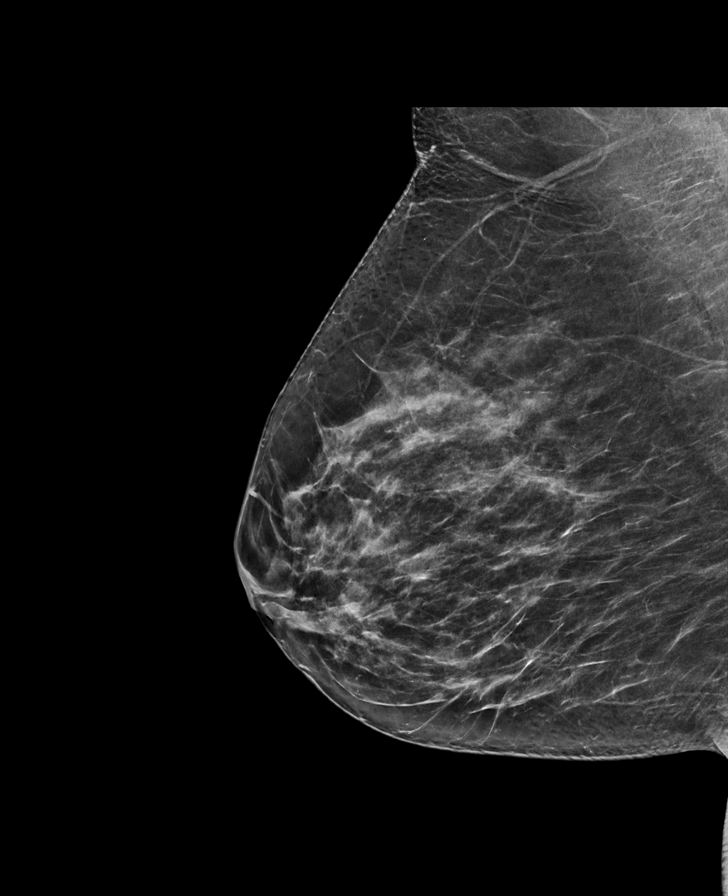

[L MLO synth-2D]
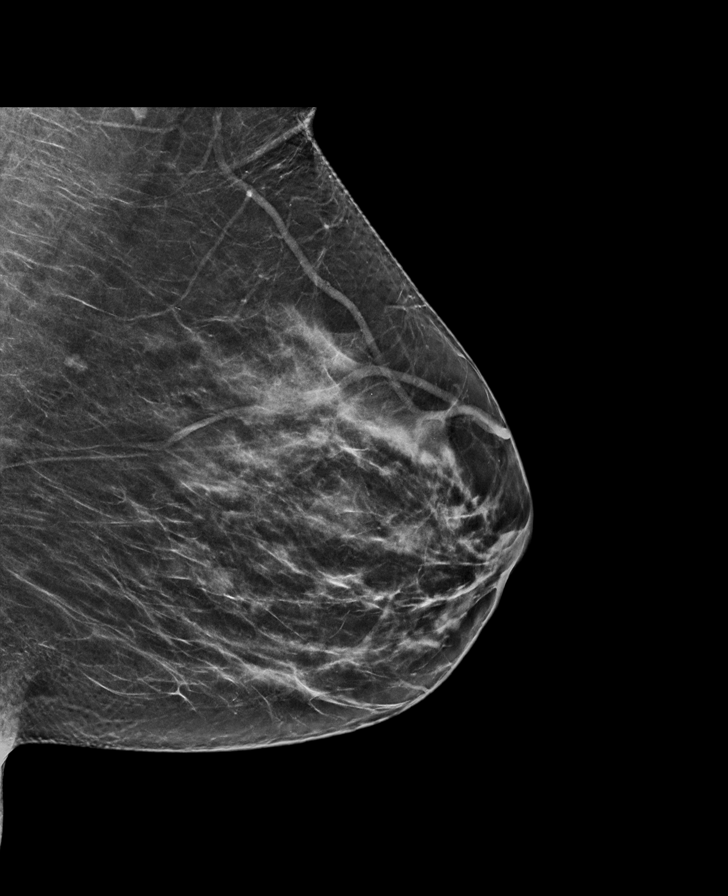

[L CC synth-2D]
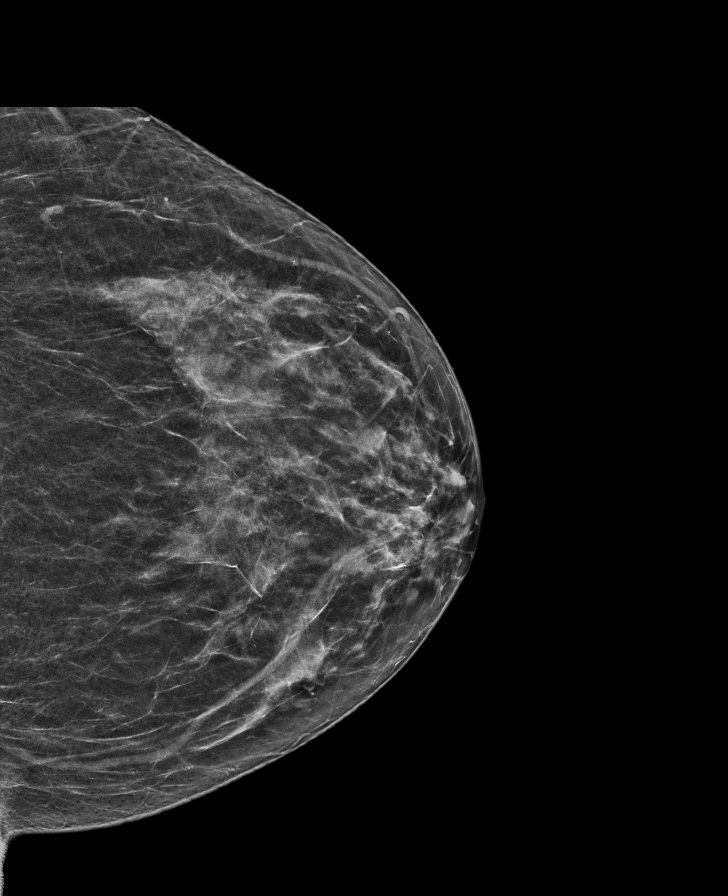

[R CC synth-2D]
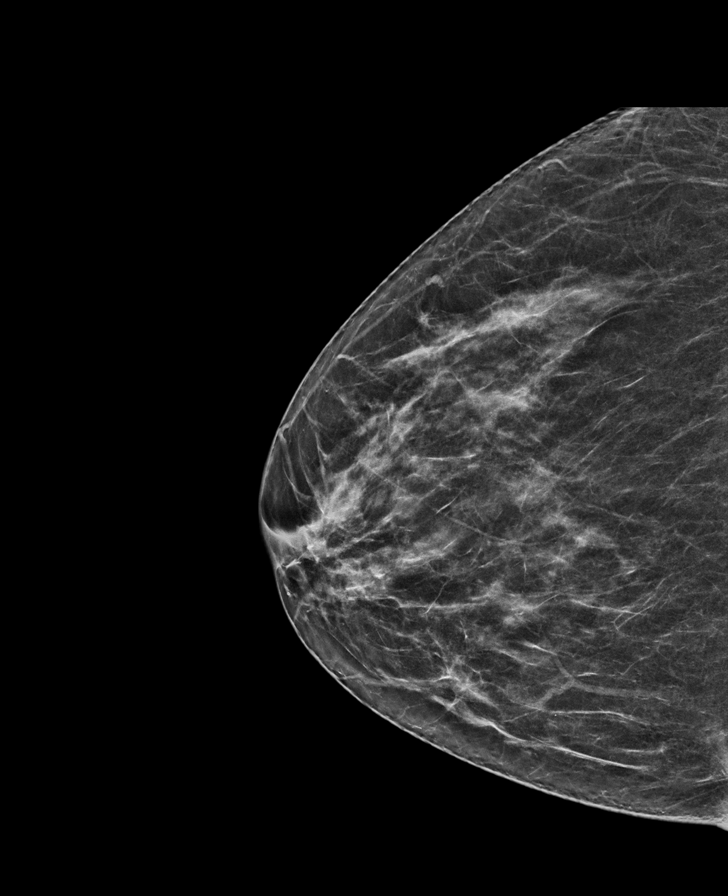

[R MLO tomo · tomo slice 35/70.0]
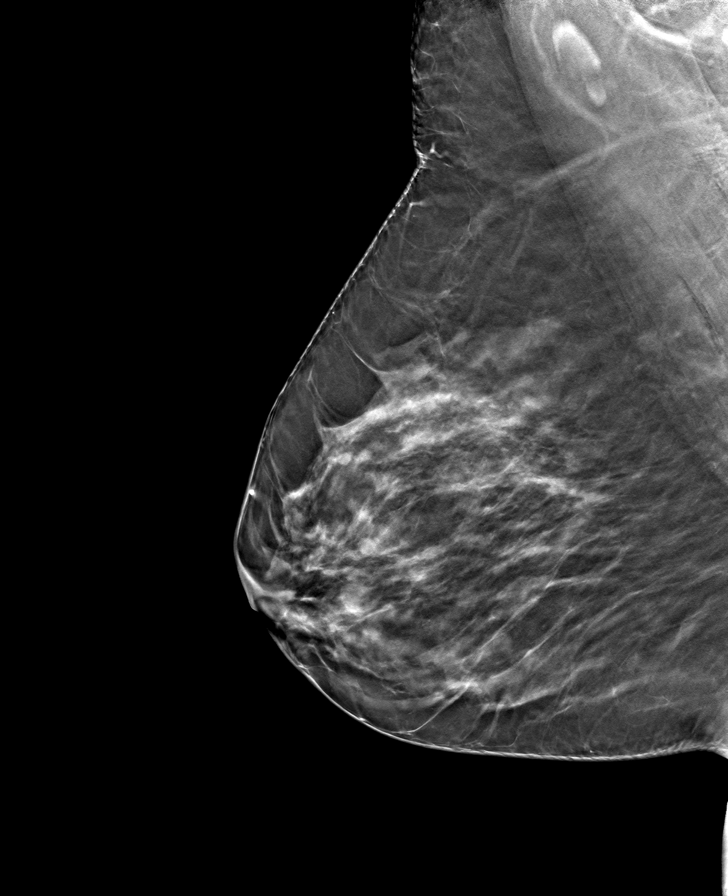

[L MLO tomo · tomo slice 35/68.0]
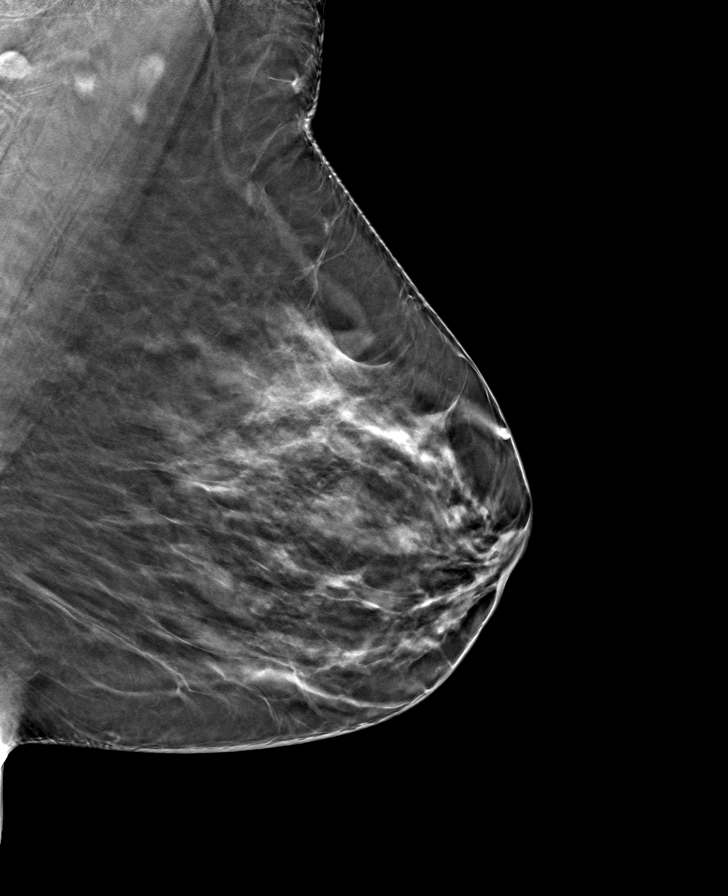

[R CC tomo · tomo slice 33/64.0]
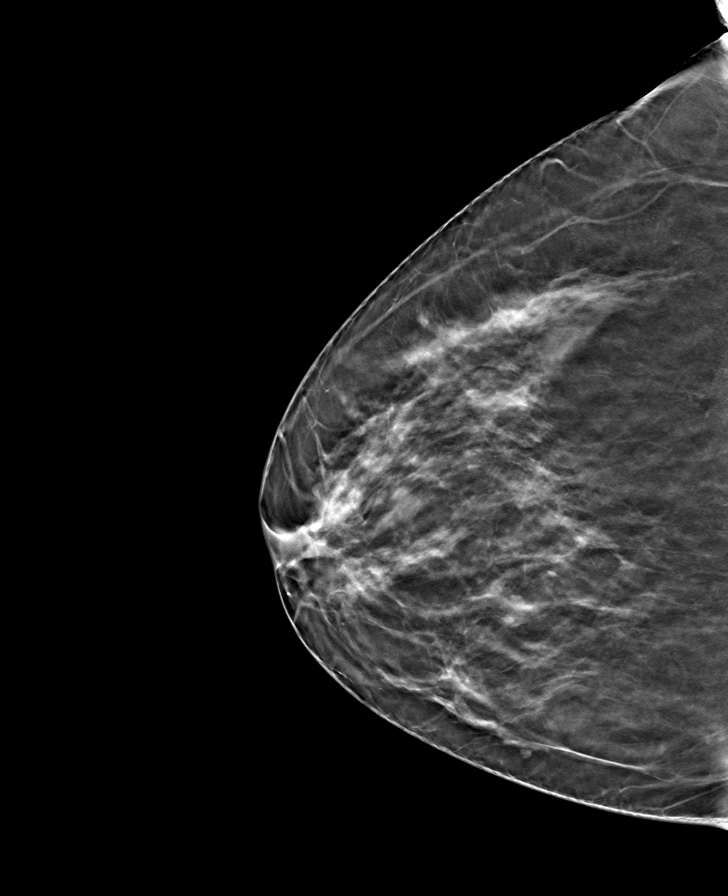

[L CC tomo · tomo slice 33/64.0]
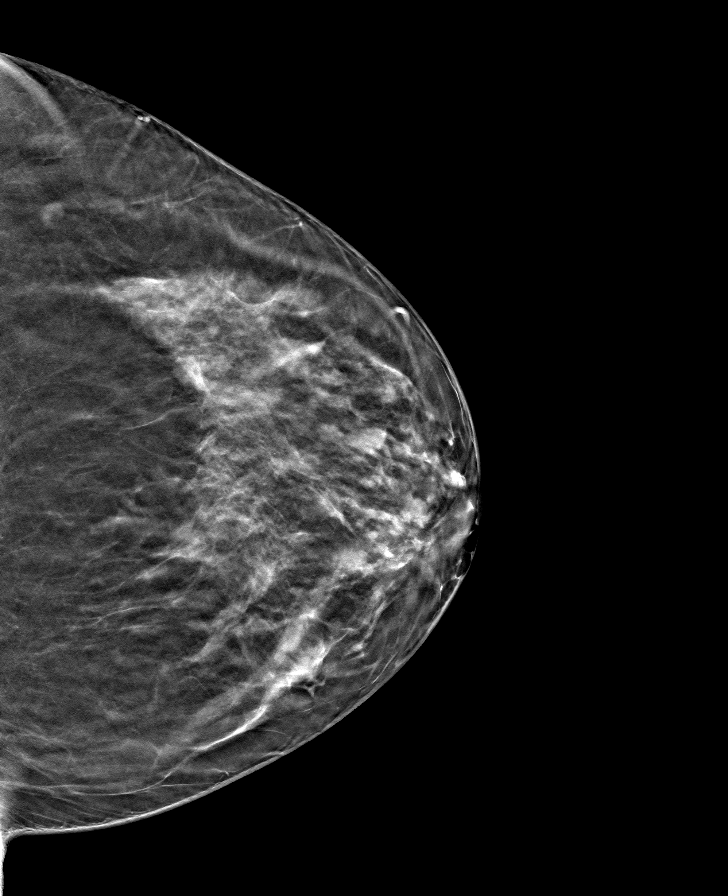

[8 of 24 positions shown; findings below may reference images not displayed]

ACR Breast Density Category c: The breast tissue is heterogeneously
dense, which may obscure small masses.
FINDINGS: Full field CC and MLO views of both breasts were obtained.

RIGHT: No findings suspicious for malignancy.

LEFT: Partially obscured isodense mass whose visible margins are
circumscribed in the LOWER OUTER QUADRANT at middle depth, measuring
approximately 8 mm, unchanged dating back to May 2019
mammogram. No new or suspicious findings elsewhere.

Targeted ultrasound is performed, again demonstrating the
circumscribed oval parallel hypoechoic mass at the 4 o'clock
position 6 cm from the nipple at middle depth, measuring
x 4 x 7 mm on 02/18/2020, 7 x 3 x 6 mm on 06/11/2019), demonstrating
no posterior characteristics.
IMPRESSION: 1. No mammographic or sonographic evidence of malignancy involving
the LEFT breast.
2. No mammographic evidence of malignancy involving the RIGHT
breast.
3. Stable 8 mm mass in the LOWER OUTER QUADRANT of the LEFT breast
at 4 o'clock 6 cm from the nipple dating back to May 2019,
confirming benignity.

RECOMMENDATION:
Screening mammogram in one year.(Code:76-G-LLA)

I have discussed the findings and recommendations with the patient.
If applicable, a reminder letter will be sent to the patient
regarding the next appointment.

BI-RADS CATEGORY  2: Benign.

## 2023-07-04 ENCOUNTER — Ambulatory Visit (INDEPENDENT_AMBULATORY_CARE_PROVIDER_SITE_OTHER): Payer: Managed Care, Other (non HMO) | Admitting: Family Medicine

## 2023-07-11 ENCOUNTER — Encounter (INDEPENDENT_AMBULATORY_CARE_PROVIDER_SITE_OTHER): Payer: Self-pay | Admitting: Family Medicine

## 2023-07-11 ENCOUNTER — Ambulatory Visit (INDEPENDENT_AMBULATORY_CARE_PROVIDER_SITE_OTHER): Payer: Managed Care, Other (non HMO) | Admitting: Family Medicine

## 2023-07-11 VITALS — BP 139/86 | HR 67 | Temp 98.1°F | Ht 67.0 in | Wt 221.0 lb

## 2023-07-11 DIAGNOSIS — E559 Vitamin D deficiency, unspecified: Secondary | ICD-10-CM

## 2023-07-11 DIAGNOSIS — E66811 Obesity, class 1: Secondary | ICD-10-CM

## 2023-07-11 DIAGNOSIS — J302 Other seasonal allergic rhinitis: Secondary | ICD-10-CM | POA: Diagnosis not present

## 2023-07-11 DIAGNOSIS — Z6831 Body mass index (BMI) 31.0-31.9, adult: Secondary | ICD-10-CM

## 2023-07-11 DIAGNOSIS — E669 Obesity, unspecified: Secondary | ICD-10-CM | POA: Diagnosis not present

## 2023-07-11 DIAGNOSIS — R7303 Prediabetes: Secondary | ICD-10-CM | POA: Diagnosis not present

## 2023-07-11 DIAGNOSIS — Z6834 Body mass index (BMI) 34.0-34.9, adult: Secondary | ICD-10-CM

## 2023-07-11 MED ORDER — LEVOCETIRIZINE DIHYDROCHLORIDE 5 MG PO TABS
5.0000 mg | ORAL_TABLET | Freq: Every evening | ORAL | 0 refills | Status: AC
Start: 2023-07-11 — End: ?

## 2023-07-11 NOTE — Progress Notes (Signed)
Katherine Adams, D.O.  ABFM, ABOM Specializing in Clinical Bariatric Medicine  Office located at: [address] [place], [place]  [address]     Assessment and Plan:  Fasting labs next OV.   Medications Discontinued During This Encounter  Medication Reason   levocetirizine (XYZAL) 5 MG tablet Reorder    Meds ordered this encounter  Medications   levocetirizine (XYZAL) 5 MG tablet    Sig: Take 1 tablet (5 mg total) by mouth every evening.    Dispense:  90 tablet    Refill:  0    Seasonal allergies Assessment: Condition is Controlled.. Pt endorses that her allergies are well controlled with Xyzal. She has also began using Flonase  before bed prn.   Plan: - Continue Xyzal 5mg  once daily. I will refill.   - I recommended pt use a normal saline sinus rinse before using her Flonase spray.    Prediabetes Assessment: Condition is Not optimized.. Pt endorses still having hunger and cravings as she is not following the prescribed meal plan. She continues to mainly craves carbs and has been eating fast food/take out. Pt A1c has remained stable at 5.7 as of August, 2024.  Lab Results  Component Value Date   HGBA1C 5.7 (H) 04/07/2023   HGBA1C 5.7 (H) 10/11/2022   HGBA1C 5.6 03/29/2022   INSULIN 5.1 10/11/2022   INSULIN 7.2 03/29/2022   INSULIN 4.7 11/12/2021    Plan: - Re-start her prudent nutritional plan that is low in simple carbohydrates, saturated fats and trans fats to goal of 5-10% weight loss to achieve significant health benefits.  Pt encouraged to continually advance exercise and cardiovascular fitness as tolerated throughout weight loss journey.  - Continue to decrease simple carbs/ sugars; increase fiber and proteins -> follow her meal plan.    - Anticipatory guidance given.    - We will recheck A1c and fasting insulin level from last check, or as deemed appropriate.    Vitamin D deficiency Assessment: Condition is Worsening.Marland Kitchen Pt vitamin D level dropped from  56.6 to 47.9 as of 04/07/2023. Pt endorses still taking OTC oral Cholecalciferol supplement and denies any adverse side effects.  Lab Results  Component Value Date   VD25OH 47.9 04/07/2023   VD25OH 56.6 10/11/2022   VD25OH 53.9 03/29/2022   Plan: - Continue OTC Cholecalciferol at current dose as directed and she was encouraged to continue to take the medicine until told otherwise.     - weight loss will likely improve availability of vitamin D, thus encouraged Katherine Adams to continue with meal plan and their weight loss efforts to further improve this condition.    - We will continue to monitor levels regularly to keep levels within normal limits and prevent over supplementation.   TREATMENT PLAN FOR OBESITY: BMI 31.0-31.9,adult- current BMI 34.61 Obesity, Current BMI 32.4 (start bmi 34.14/date 03/03/21) Assessment:  Katherine Adams is here to discuss her progress with her obesity treatment plan along with follow-up of her obesity related diagnoses. See Medical Weight Management Flowsheet for complete bioelectrical impedance results.  Condition is not optimized. Biometric data collected today, was reviewed with patient.   Since last office visit on 04/07/2023 patient's  Muscle mass has increased by 0.4lb. Fat mass has increased by 11lb. Total body water has increased by 4.6lb.  Counseling done on how various foods will affect these numbers and how to maximize success  Total lbs lost to date: +3 Total weight loss percentage to date: +1.38%   Plan: -  Katherine Adams will work on healthier eating habits and try to follow the Category 1 meal plan with breakfast and lunch options best they can.   Behavioral Intervention Additional resources provided today: category 1 meal plan information, breakfast options, and lunch options Evidence-based interventions for health behavior change were utilized today including the discussion of self monitoring techniques, problem-solving barriers and SMART goal setting techniques.    Regarding patient's less desirable eating habits and patterns, we employed the technique of small changes.  Pt will specifically work on: Follow Category 1 meal plan and measure/weigh food intake for next visit.     She has agreed to Continue current level of physical activity  and continue to gradually increase the amount and intensity of exercise    FOLLOW UP: Return in about 4 weeks (around 08/08/2023).  She was informed of the importance of frequent follow up visits to maximize her success with intensive lifestyle modifications for her multiple health conditions.  Subjective:   Chief complaint: Obesity Katherine Adams is here to discuss her progress with her obesity treatment plan. She is on the the Category 1 Plan and states she is following her eating plan approximately 0% of the time. She states she is going to physical therapy 30-60 minutes 4-5 days per week and bike riding 10-15 minutes 7 days per week.  Interval History:  Katherine Adams is here for a follow up office visit.     Since last office visit:  Pt was last seen on 04/07/2023. Pt admits that she has not been following the prescribed meal plan and eating out too much and consuming too many carbs. She informed me that she had knee replacement surgery in August and feel a couple weeks after prolonging her PT. She informed me that she did try to journal.   We reviewed her meal plan and all questions were answered.   Pharmacotherapy for weight loss: She is currently taking Wellbutrin for medical weight loss.  Denies side effects.    Review of Systems:  Pertinent positives were addressed with patient today.  Reviewed by clinician on day of visit: allergies, medications, problem list, medical history, surgical history, family history, social history, and previous encounter notes.  Weight Summary and Biometrics   Weight Lost Since Last Visit: 0lb  Weight Gained Since Last Visit: 12lb   Vitals Temp: 98.1 F (36.7 C) BP: 139/86 Pulse  Rate: 67 SpO2: 98 %   Anthropometric Measurements Height: 5\' 7"  (1.702 m) Weight: 221 lb (100.2 kg) BMI (Calculated): 34.61 Weight at Last Visit: 209 Weight Lost Since Last Visit: 0lb Weight Gained Since Last Visit: 12lb Starting Weight: 218lb Total Weight Loss (lbs): 0 lb (0 kg)   Body Composition  Body Fat %: 47.5 % Fat Mass (lbs): 105 lbs Muscle Mass (lbs): 110.4 lbs Total Body Water (lbs): 81.8 lbs Visceral Fat Rating : 14   Other Clinical Data Fasting: No Today's Visit #: 12 Starting Date: 03/03/21     Objective:   PHYSICAL EXAM: Blood pressure 139/86, pulse 67, temperature 98.1 F (36.7 C), height 5\' 7"  (1.702 m), weight 221 lb (100.2 kg), last menstrual period 12/21/2013, SpO2 98%. Body mass index is 34.61 kg/m.  General: Well Developed, well nourished, and in no acute distress.  HEENT: Normocephalic, atraumatic Skin: Warm and dry, cap RF less 2 sec, good turgor Chest:  Normal excursion, shape, no gross abn Respiratory: speaking in full sentences, no conversational dyspnea NeuroM-Sk: Ambulates w/o assistance, moves * 4 Psych: A and O *3, insight good,  mood-full  DIAGNOSTIC DATA REVIEWED:  BMET    Component Value Date/Time   NA 141 10/11/2022 0947   K 4.8 10/11/2022 0947   CL 102 10/11/2022 0947   CO2 24 10/11/2022 0947   GLUCOSE 72 10/11/2022 0947   GLUCOSE 93 01/13/2016 0947   BUN 21 10/11/2022 0947   CREATININE 0.97 10/11/2022 0947   CREATININE 0.87 01/13/2016 0947   CALCIUM 9.4 10/11/2022 0947   GFRNONAA 71 01/11/2018 1605   GFRAA 82 01/11/2018 1605   Lab Results  Component Value Date   HGBA1C 5.7 (H) 04/07/2023   HGBA1C 6.1 (H) 03/03/2021   Lab Results  Component Value Date   INSULIN 5.1 10/11/2022   INSULIN 8.5 03/03/2021   Lab Results  Component Value Date   TSH 1.230 04/07/2023   CBC    Component Value Date/Time   WBC 9.2 03/03/2021 0837   WBC 6.1 01/13/2016 0947   RBC 5.12 03/03/2021 0837   RBC 4.49 01/13/2016 0947    HGB 14.7 03/03/2021 0837   HGB 13.2 01/09/2015 1144   HCT 46.2 03/03/2021 0837   PLT 285 03/03/2021 0837   MCV 90 03/03/2021 0837   MCH 28.7 03/03/2021 0837   MCH 30.3 01/13/2016 0947   MCHC 31.8 03/03/2021 0837   MCHC 32.9 01/13/2016 0947   RDW 14.1 03/03/2021 0837   Iron Studies No results found for: "IRON", "TIBC", "FERRITIN", "IRONPCTSAT" Lipid Panel     Component Value Date/Time   CHOL 212 (H) 10/11/2022 0947   TRIG 52 10/11/2022 0947   HDL 72 10/11/2022 0947   CHOLHDL 3.1 11/12/2021 1120   CHOLHDL 3.0 01/13/2016 0947   VLDL 14 01/13/2016 0947   LDLCALC 131 (H) 10/11/2022 0947   Hepatic Function Panel     Component Value Date/Time   PROT 6.8 10/11/2022 0947   ALBUMIN 4.6 10/11/2022 0947   AST 26 10/11/2022 0947   ALT 21 10/11/2022 0947   ALKPHOS 84 10/11/2022 0947   BILITOT 0.4 10/11/2022 0947      Component Value Date/Time   TSH 1.230 04/07/2023 0759   Nutritional Lab Results  Component Value Date   VD25OH 47.9 04/07/2023   VD25OH 56.6 10/11/2022   VD25OH 53.9 03/29/2022    Attestations:   I, Clinical biochemist, acting as a Stage manager for [employer], DO., have compiled all relevant documentation for today's office visit on behalf of Katherine Lot, DO, while in the presence of [employer].  I have reviewed the above documentation for accuracy and completeness, and I agree with the above. Katherine Adams, D.O.  The 21st Century Cures Act was signed into law in 2016 which includes the topic of electronic health records.  This provides immediate access to information in MyChart.  This includes consultation notes, operative notes, office notes, lab results and pathology reports.  If you have any questions about what you read please let us know at your next visit so we can discuss your concerns and take corrective action if need be.  We are right here with you.

## 2023-10-18 ENCOUNTER — Other Ambulatory Visit: Payer: Self-pay | Admitting: Physician Assistant

## 2023-10-18 DIAGNOSIS — Z1231 Encounter for screening mammogram for malignant neoplasm of breast: Secondary | ICD-10-CM

## 2023-11-08 ENCOUNTER — Ambulatory Visit
Admission: RE | Admit: 2023-11-08 | Discharge: 2023-11-08 | Disposition: A | Discharge status: Home / Self Care | Payer: Managed Care, Other (non HMO) | Source: Ambulatory Visit | Attending: Physician Assistant | Admitting: Physician Assistant

## 2023-11-08 DIAGNOSIS — Z1231 Encounter for screening mammogram for malignant neoplasm of breast: Secondary | ICD-10-CM

## 2024-09-17 ENCOUNTER — Ambulatory Visit (HOSPITAL_COMMUNITY)
Admission: RE | Admit: 2024-09-17 | Discharge: 2024-09-17 | Disposition: A | Discharge status: Home / Self Care | Source: Ambulatory Visit | Attending: Orthopedic Surgery | Admitting: Orthopedic Surgery

## 2024-09-17 ENCOUNTER — Other Ambulatory Visit (HOSPITAL_COMMUNITY): Payer: Self-pay | Admitting: Orthopedic Surgery

## 2024-09-17 DIAGNOSIS — M79661 Pain in right lower leg: Secondary | ICD-10-CM | POA: Diagnosis not present
